# Patient Record
Sex: Male | Born: 1955 | Race: White | Hispanic: No | Marital: Married | Smoking: Current every day smoker
Health system: Southern US, Community
[De-identification: ages and names within clinical notes are randomized; demographics above are authoritative.]

## PROBLEM LIST (undated history)

## (undated) DIAGNOSIS — H9190 Unspecified hearing loss, unspecified ear: Secondary | ICD-10-CM

## (undated) DIAGNOSIS — E7800 Pure hypercholesterolemia, unspecified: Secondary | ICD-10-CM

## (undated) DIAGNOSIS — J449 Chronic obstructive pulmonary disease, unspecified: Secondary | ICD-10-CM

## (undated) HISTORY — PX: EYE SURGERY: SHX253

---

## 2004-11-05 ENCOUNTER — Ambulatory Visit: Payer: Self-pay | Admitting: Family Medicine

## 2006-01-09 ENCOUNTER — Ambulatory Visit: Payer: Self-pay | Admitting: Family Medicine

## 2006-01-16 ENCOUNTER — Ambulatory Visit: Payer: Self-pay | Admitting: Family Medicine

## 2006-03-12 ENCOUNTER — Ambulatory Visit (HOSPITAL_COMMUNITY): Admission: RE | Admit: 2006-03-12 | Discharge: 2006-03-13 | Payer: Self-pay | Admitting: Neurosurgery

## 2007-11-11 ENCOUNTER — Ambulatory Visit: Payer: Self-pay | Admitting: Internal Medicine

## 2009-01-06 ENCOUNTER — Ambulatory Visit: Payer: Self-pay | Admitting: Gastroenterology

## 2009-05-10 ENCOUNTER — Ambulatory Visit: Payer: Self-pay | Admitting: Internal Medicine

## 2010-04-28 ENCOUNTER — Ambulatory Visit: Payer: Self-pay | Admitting: Oncology

## 2010-06-11 ENCOUNTER — Ambulatory Visit: Payer: Self-pay | Admitting: Oncology

## 2010-06-20 ENCOUNTER — Ambulatory Visit: Payer: Self-pay | Admitting: Gastroenterology

## 2010-06-28 ENCOUNTER — Ambulatory Visit: Payer: Self-pay | Admitting: Oncology

## 2010-07-29 ENCOUNTER — Ambulatory Visit: Payer: Self-pay | Admitting: Oncology

## 2010-08-09 ENCOUNTER — Ambulatory Visit: Payer: Self-pay | Admitting: Urology

## 2010-08-11 ENCOUNTER — Emergency Department: Payer: Self-pay | Admitting: Emergency Medicine

## 2010-09-06 ENCOUNTER — Ambulatory Visit: Payer: Self-pay | Admitting: Urology

## 2011-04-10 ENCOUNTER — Ambulatory Visit: Payer: Self-pay | Admitting: Urology

## 2011-04-10 DIAGNOSIS — R0602 Shortness of breath: Secondary | ICD-10-CM

## 2011-04-11 ENCOUNTER — Ambulatory Visit: Payer: Self-pay | Admitting: Urology

## 2011-05-13 ENCOUNTER — Ambulatory Visit: Payer: Self-pay | Admitting: Urology

## 2011-08-22 ENCOUNTER — Ambulatory Visit: Payer: Self-pay | Admitting: Internal Medicine

## 2011-09-18 ENCOUNTER — Emergency Department: Payer: Self-pay | Admitting: Emergency Medicine

## 2011-09-18 LAB — LIPASE, BLOOD: Lipase: 152 U/L (ref 73–393)

## 2011-09-18 LAB — COMPREHENSIVE METABOLIC PANEL
Alkaline Phosphatase: 63 U/L (ref 50–136)
Anion Gap: 9 (ref 7–16)
BUN: 10 mg/dL (ref 7–18)
Bilirubin,Total: 0.3 mg/dL (ref 0.2–1.0)
Chloride: 103 mmol/L (ref 98–107)
Creatinine: 0.89 mg/dL (ref 0.60–1.30)
EGFR (African American): >60
EGFR (Non-African Amer.): >60
Osmolality: 273 (ref 275–301)
SGOT(AST): 22 U/L (ref 15–37)
SGPT (ALT): 28 U/L
Total Protein: 7.4 g/dL (ref 6.4–8.2)

## 2011-09-18 LAB — CBC
MCV: 95 fL (ref 80–100)
Platelet: 296 10*3/uL (ref 150–440)
RBC: 4.52 10*6/uL (ref 4.40–5.90)
RDW: 13.1 % (ref 11.5–14.5)
WBC: 10.3 10*3/uL (ref 3.8–10.6)

## 2011-09-18 LAB — URINALYSIS, COMPLETE
Bacteria: NONE SEEN
Bilirubin,UR: NEGATIVE
Ketone: NEGATIVE
RBC,UR: 1 /HPF (ref 0–5)
Specific Gravity: 1.005 (ref 1.003–1.030)
Squamous Epithelial: NONE SEEN
WBC UR: <1 /HPF (ref 0–5)

## 2012-09-01 ENCOUNTER — Emergency Department: Payer: Self-pay | Admitting: Emergency Medicine

## 2012-09-01 LAB — BASIC METABOLIC PANEL
BUN: 12 mg/dL (ref 7–18)
Chloride: 100 mmol/L (ref 98–107)
EGFR (African American): >60
EGFR (Non-African Amer.): >60

## 2012-09-01 LAB — URINALYSIS, COMPLETE
Glucose,UR: NEGATIVE mg/dL (ref 0–75)
Hyaline Cast: 1
Ph: 5 (ref 4.5–8.0)
RBC,UR: 160 /HPF (ref 0–5)
Squamous Epithelial: NONE SEEN

## 2012-09-01 LAB — CBC
MCHC: 33.8 g/dL (ref 32.0–36.0)
RBC: 4.94 10*6/uL (ref 4.40–5.90)
RDW: 13.7 % (ref 11.5–14.5)

## 2012-09-01 LAB — DIFFERENTIAL: Monocyte %: 4.9 %

## 2012-09-17 ENCOUNTER — Ambulatory Visit: Payer: Self-pay | Admitting: Urology

## 2013-03-23 DIAGNOSIS — J449 Chronic obstructive pulmonary disease, unspecified: Secondary | ICD-10-CM | POA: Diagnosis present

## 2015-01-18 ENCOUNTER — Emergency Department
Admission: EM | Admit: 2015-01-18 | Discharge: 2015-01-18 | Disposition: A | Discharge status: Home / Self Care | Payer: Self-pay | Attending: Emergency Medicine | Admitting: Emergency Medicine

## 2015-01-18 ENCOUNTER — Emergency Department: Payer: Self-pay

## 2015-01-18 ENCOUNTER — Encounter: Payer: Self-pay | Admitting: Emergency Medicine

## 2015-01-18 DIAGNOSIS — Z720 Tobacco use: Secondary | ICD-10-CM | POA: Insufficient documentation

## 2015-01-18 DIAGNOSIS — W109XXA Fall (on) (from) unspecified stairs and steps, initial encounter: Secondary | ICD-10-CM | POA: Insufficient documentation

## 2015-01-18 DIAGNOSIS — Y9389 Activity, other specified: Secondary | ICD-10-CM | POA: Insufficient documentation

## 2015-01-18 DIAGNOSIS — Y998 Other external cause status: Secondary | ICD-10-CM | POA: Insufficient documentation

## 2015-01-18 DIAGNOSIS — S93402A Sprain of unspecified ligament of left ankle, initial encounter: Secondary | ICD-10-CM | POA: Insufficient documentation

## 2015-01-18 DIAGNOSIS — Y92520 Airport as the place of occurrence of the external cause: Secondary | ICD-10-CM | POA: Insufficient documentation

## 2015-01-18 HISTORY — DX: Pure hypercholesterolemia, unspecified: E78.00

## 2015-01-18 MED ORDER — TRAMADOL HCL 50 MG PO TABS: 50.0000 mg | ORAL_TABLET | Freq: Four times a day (QID) | ORAL | Status: AC | PRN

## 2015-01-18 MED ORDER — IBUPROFEN 800 MG PO TABS: 800.0000 mg | ORAL_TABLET | Freq: Three times a day (TID) | ORAL | Status: DC | PRN

## 2015-01-18 MED ORDER — OXYCODONE-ACETAMINOPHEN 5-325 MG PO TABS
ORAL_TABLET | ORAL | Status: AC
  Filled 2015-01-18: qty 1

## 2015-01-18 MED ORDER — IBUPROFEN 800 MG PO TABS
ORAL_TABLET | ORAL | Status: AC
  Filled 2015-01-18: qty 1

## 2015-01-18 MED ORDER — IBUPROFEN 800 MG PO TABS
800.0000 mg | ORAL_TABLET | Freq: Once | ORAL | Status: AC
  Administered 2015-01-18: 800 mg via ORAL

## 2015-01-18 MED ORDER — OXYCODONE-ACETAMINOPHEN 5-325 MG PO TABS
1.0000 | ORAL_TABLET | Freq: Once | ORAL | Status: AC
  Administered 2015-01-18: 1 via ORAL

## 2015-01-18 NOTE — ED Provider Notes (Signed)
[facility] Emergency Department Provider Note  ____________________________________________  Time seen: Approximately 12:24 PM  I have reviewed the triage vital signs and the nursing notes.   HISTORY  Chief Complaint Ankle Pain    HPI Frederick Mitchell is a 59 y.o. male for pain and edema secondary to a fall last night.Patient fell down a flight of stairs last night. Awakened this morning with medial left ankle edema and increased pain. Patient rates his pain as a 7/10 with increased with weightbearing. Patient denies any other pain in his body except for the chief complaint.   Past Medical History  Diagnosis Date  . Hypercholesteremia unk    There are no active problems to display for this patient.   Past Surgical History  Procedure Laterality Date  . Eye surgery      Current Outpatient Rx  Name  Route  Sig  Dispense  Refill  . ibuprofen (ADVIL,MOTRIN) 800 MG tablet   Oral   Take 1 tablet (800 mg total) by mouth every 8 (eight) hours as needed for moderate pain.   15 tablet   0   . traMADol (ULTRAM) 50 MG tablet   Oral   Take 1 tablet (50 mg total) by mouth every 6 (six) hours as needed.   20 tablet   0     Allergies Review of patient's allergies indicates no known allergies.  No family history on file.  Social History History  Substance Use Topics  . Smoking status: Current Every Day Smoker  . Smokeless tobacco: Not on file  . Alcohol Use: No    Review of Systems Constitutional: No fever/chills Eyes: No visual changes. ENT: No sore throat. Cardiovascular: Denies chest pain. Respiratory: Denies shortness of breath. Gastrointestinal: No abdominal pain.  No nausea, no vomiting.  No diarrhea.  No constipation. Genitourinary: Negative for dysuria. Musculoskeletal: Left ankle pain and edema Skin: Negative for rash. Neurological: Negative for headaches, focal weakness or numbness. 10-point ROS otherwise  negative.  ____________________________________________   PHYSICAL EXAM:  VITAL SIGNS: ED Triage Vitals  Enc Vitals Group     BP 01/18/15 1205 112/69 mmHg     Pulse Rate 01/18/15 1205 75     Resp 01/18/15 1205 18     Temp 01/18/15 1205 98.4 F (36.9 C)     Temp Source 01/18/15 1205 Oral     SpO2 01/18/15 1205 100 %     Weight 01/18/15 1205 190 lb (86.183 kg)     Height 01/18/15 1205 '5\' 8"'$  (1.727 m)     Head Cir --      Peak Flow --      Pain Score 01/18/15 1206 7     Pain Loc --      Pain Edu? --      Excl. in [place]? --    Constitutional: Alert and oriented. Well appearing and in no acute distress. Eyes: Conjunctivae are normal. PERRL. EOMI. Head: Atraumatic. Nose: No congestion/rhinnorhea. Mouth/Throat: Mucous membranes are moist.  Oropharynx non-erythematous. Neck: No stridor. No deformity for nuchal range of motion nontender palpation. Hematological/Lymphatic/Immunilogical: No cervical lymphadenopathy. Cardiovascular: Normal rate, regular rhythm. Grossly normal heart sounds.  Good peripheral circulation. Respiratory: Normal respiratory effort.  No retractions. Lungs CTAB. Gastrointestinal: Soft and nontender. No distention. No abdominal bruits. No CVA tenderness. Musculoskeletal: No obvious deformity of the left ankle. Obvious edema. Tender palpation medial malleolus. Decreased range of motion  inversions secondary to complain of pain.  Neurologic:  Normal speech and language. No  gross focal neurologic deficits are appreciated. Speech is normal... Skin:  Skin is warm, dry and intact. No rash noted. Psychiatric: Mood and affect are normal. Speech and behavior are normal.  ____________________________________________   LABS (all labs ordered are listed, but only abnormal results are displayed)  Labs Reviewed - No data to display ____________________________________________  EKG   ____________________________________________  RADIOLOGY  I, Sable Feil,  personally viewed and evaluated these images as part of my medical decision making.  No acute findings ____________________________________________   PROCEDURES  Procedure(s) performed: None  Critical Care performed: No  ____________________________________________   INITIAL IMPRESSION / ASSESSMENT AND PLAN / ED COURSE  Pertinent labs & imaging results that were available during my care of the patient were reviewed by me and considered in my medical decision making (see chart for details).  Sprain ankle. Patient placed in a posterior ankle splint and given instructions on crutches. Patient prescribed tramadol and Motrin to take as needed for pain and swelling. Patient advised [facility]. ____________________________________________   FINAL CLINICAL IMPRESSION(S) / ED DIAGNOSES  Final diagnoses:  Sprain of left ankle, initial encounter      Sable Feil, PA-C 01/18/15 1342  Ahmed Prima, MD 01/18/15 [phone]

## 2015-01-18 NOTE — Discharge Instructions (Signed)
Ankle Sprain  An ankle sprain is an injury to the strong, fibrous tissues (ligaments) that hold your ankle bones together.   HOME CARE   · Put ice on your ankle for 1-2 days or as told by your doctor.  ¨ Put ice in a plastic bag.  ¨ Place a towel between your skin and the bag.  ¨ Leave the ice on for 15-20 minutes at a time, every 2 hours while you are awake.  · Only take medicine as told by your doctor.  · Raise (elevate) your injured ankle above the level of your heart as much as possible for 2-3 days.  · Use crutches if your doctor tells you to. Slowly put your own weight on the affected ankle. Use the crutches until you can walk without pain.  · If you have a plaster splint:  ¨ Do not rest it on anything harder than a pillow for 24 hours.  ¨ Do not put weight on it.  ¨ Do not get it wet.  ¨ Take it off to shower or bathe.  · If given, use an elastic wrap or support stocking for support. Take the wrap off if your toes lose feeling (numb), tingle, or turn cold or blue.  · If you have an air splint:  ¨ Add or let out air to make it comfortable.  ¨ Take it off at night and to shower and bathe.  ¨ Wiggle your toes and move your ankle up and down often while you are wearing it.  GET HELP IF:  · You have rapidly increasing bruising or puffiness (swelling).  · Your toes feel very cold.  · You lose feeling in your foot.  · Your medicine does not help your pain.  GET HELP RIGHT AWAY IF:   · Your toes lose feeling (numb) or turn blue.  · You have severe pain that is increasing.  MAKE SURE YOU:   · Understand these instructions.  · Will watch your condition.  · Will get help right away if you are not doing well or get worse.  Document Released: 01/01/2008 Document Revised: 11/29/2013 Document Reviewed: 01/27/2012  Exit[employer]® Patient Information ©2015 [employer]. This information is not intended to replace advice given to you by your health care provider. Make sure you discuss any questions you have with your health care  provider.

## 2015-01-18 NOTE — ED Notes (Signed)
Fell last pm  Left ankle swollen and tender

## 2016-07-30 DIAGNOSIS — F17200 Nicotine dependence, unspecified, uncomplicated: Secondary | ICD-10-CM | POA: Diagnosis not present

## 2016-07-30 DIAGNOSIS — J449 Chronic obstructive pulmonary disease, unspecified: Secondary | ICD-10-CM | POA: Diagnosis not present

## 2016-08-07 DIAGNOSIS — M5432 Sciatica, left side: Secondary | ICD-10-CM | POA: Diagnosis not present

## 2019-11-25 ENCOUNTER — Encounter: Payer: Self-pay | Admitting: Emergency Medicine

## 2019-11-25 ENCOUNTER — Emergency Department
Admission: EM | Admit: 2019-11-25 | Discharge: 2019-11-25 | Disposition: A | Discharge status: Home / Self Care | Payer: Medicare HMO | Attending: Emergency Medicine | Admitting: Emergency Medicine

## 2019-11-25 ENCOUNTER — Emergency Department: Payer: Medicare HMO

## 2019-11-25 ENCOUNTER — Other Ambulatory Visit: Payer: Self-pay

## 2019-11-25 DIAGNOSIS — Y999 Unspecified external cause status: Secondary | ICD-10-CM | POA: Diagnosis not present

## 2019-11-25 DIAGNOSIS — T18108A Unspecified foreign body in esophagus causing other injury, initial encounter: Secondary | ICD-10-CM | POA: Insufficient documentation

## 2019-11-25 DIAGNOSIS — F17200 Nicotine dependence, unspecified, uncomplicated: Secondary | ICD-10-CM | POA: Diagnosis not present

## 2019-11-25 DIAGNOSIS — T17208A Unspecified foreign body in pharynx causing other injury, initial encounter: Secondary | ICD-10-CM | POA: Diagnosis present

## 2019-11-25 DIAGNOSIS — Y929 Unspecified place or not applicable: Secondary | ICD-10-CM | POA: Insufficient documentation

## 2019-11-25 DIAGNOSIS — Y939 Activity, unspecified: Secondary | ICD-10-CM | POA: Insufficient documentation

## 2019-11-25 DIAGNOSIS — X58XXXA Exposure to other specified factors, initial encounter: Secondary | ICD-10-CM | POA: Insufficient documentation

## 2019-11-25 HISTORY — DX: Unspecified hearing loss, unspecified ear: H91.90

## 2019-11-25 MED ORDER — ALUM & MAG HYDROXIDE-SIMETH 200-200-20 MG/5ML PO SUSP
30.0000 mL | Freq: Once | ORAL | Status: AC
Start: 2019-11-25 — End: 2019-11-25
  Administered 2019-11-25: 06:00:00 30 mL via ORAL
  Filled 2019-11-25: qty 30

## 2019-11-25 MED ORDER — LIDOCAINE VISCOUS HCL 2 % MT SOLN
15.0000 mL | Freq: Once | OROMUCOSAL | Status: AC
  Administered 2019-11-25: 15 mL via ORAL
  Filled 2019-11-25: qty 15

## 2019-11-25 NOTE — ED Triage Notes (Signed)
Patient ambulatory to triage with steady gait, without difficulty or distress noted, mask in place; using sign language video interpreter, pt reports he ate shrimp at 630pm and shell feels as if it is "stuck in throat" causing discomfort; pt able to tolerate POs without difficulty, denies difficulty breathing

## 2019-11-25 NOTE — ED Provider Notes (Signed)
[facility] Emergency Department Provider Note  ____________________________________________   First MD Initiated Contact with Patient 11/25/19 [phone]     (approximate)  I have reviewed the triage vital signs and the nursing notes.   HISTORY  Chief Complaint Foreign Body History obtained via sign language interpreter  HPI Frederick Mitchell is a 64 y.o. male presents to the emergency department secondary to foreign body sensation in his throat.  Patient states that he was eating shrimp last night and believe that he did not peel the shrimp completely before ingesting it and since that time is felt a foreign body sensation in his throat.  Patient states that this began at 6:30 PM last night.  Patient states that current discomfort is 4 out of 10.  Patient stated he has drank copious amounts of water and soda without any improvement of discomfort.       Past Medical History:  Diagnosis Date  . Deaf   . Hypercholesteremia unk    There are no problems to display for this patient.   Past Surgical History:  Procedure Laterality Date  . EYE SURGERY      Prior to Admission medications   Medication Sig Start Date End Date Taking? Authorizing Provider  ibuprofen (ADVIL,MOTRIN) 800 MG tablet Take 1 tablet (800 mg total) by mouth every 8 (eight) hours as needed for moderate pain. 01/18/15   Sable Feil, PA-C    Allergies Patient has no known allergies.  No family history on file.  Social History Social History   Tobacco Use  . Smoking status: Current Every Day Smoker  . Smokeless tobacco: Never Used  Substance Use Topics  . Alcohol use: No  . Drug use: Not on file    Review of Systems Constitutional: No fever/chills Eyes: No visual changes. ENT: Positive for foreign body sensation in throat. Cardiovascular: Denies chest pain. Respiratory: Denies shortness of breath. Gastrointestinal: No abdominal pain.  No nausea, no vomiting.  No diarrhea.  No  constipation. Genitourinary: Negative for dysuria. Musculoskeletal: Negative for neck pain.  Negative for back pain. Integumentary: Negative for rash. Neurological: Negative for headaches, focal weakness or numbness.   ____________________________________________   PHYSICAL EXAM:  VITAL SIGNS: ED Triage Vitals  Enc Vitals Group     BP 11/25/19 0304 133/70     Pulse Rate 11/25/19 0304 94     Resp 11/25/19 0304 20     Temp 11/25/19 0304 97.9 F (36.6 C)     Temp Source 11/25/19 0304 Oral     SpO2 11/25/19 0304 95 %     Weight 11/25/19 0310 87.1 kg (192 lb)     Height 11/25/19 0310 1.702 m (5\' 7" )     Head Circumference --      Peak Flow --      Pain Score 11/25/19 0309 4     Pain Loc --      Pain Edu? --      Excl. in [place]? --     Constitutional: Alert and oriented.  Eyes: Conjunctivae are normal.  Mouth/Throat: Patient is wearing a mask. Neck: No stridor.  No meningeal signs.   Cardiovascular: Normal rate, regular rhythm. Good peripheral circulation. Grossly normal heart sounds. Respiratory: Normal respiratory effort.  No retractions. Gastrointestinal: Soft and nontender. No distention.  Musculoskeletal: No lower extremity tenderness nor edema. No gross deformities of extremities. Neurologic:  Normal speech and language. No gross focal neurologic deficits are appreciated.  Skin:  Skin is warm, dry  and intact. Psychiatric: Mood and affect are normal. Speech and behavior are normal.  ____________________________________________     RADIOLOGY I, [place], personally viewed and evaluated these images (plain radiographs) as part of my medical decision making, as well as reviewing the written report by the radiologist.  ED MD interpretation: No radiopaque foreign body  Official radiology report(s): DG Neck Soft Tissue  Result Date: 11/25/2019 CLINICAL DATA:  Question of foreign body EXAM: NECK SOFT TISSUES - 1+ VIEW COMPARISON:  None. FINDINGS: There is no  evidence of retropharyngeal soft tissue swelling or epiglottic enlargement. The cervical airway is unremarkable and no radio-opaque foreign body identified. The patient is status post ACDF at C6-C7. There is an anterolisthesis of 2 C4 on C5 measuring 3 mm. IMPRESSION: No radiopaque foreign body. Electronically Signed   By: Prudencio Pair M.D.   On: 11/25/2019 03:39     Procedures   ____________________________________________   INITIAL IMPRESSION / MDM / [place] / ED COURSE  As part of my medical decision making, I reviewed the following data within the electronic MEDICAL RECORD NUMBER  64 year old male presented with above-stated history and physical exam differential diagnosis including but not limited to esophageal foreign body, globus pallidus.  Soft tissue x-ray of the neck reveals no radiopaque foreign body.  Patient given GI cocktail and cola to drink in the room which he tolerated without any difficulty.  Patient will be referred to ENT for further outpatient evaluation.  ____________________________________________  FINAL CLINICAL IMPRESSION(S) / ED DIAGNOSES  Final diagnoses:  Foreign body in esophagus, initial encounter     MEDICATIONS GIVEN DURING THIS VISIT:  Medications  alum & mag hydroxide-simeth (MAALOX/MYLANTA) 200-200-20 MG/5ML suspension 30 mL (30 mLs Oral Given 11/25/19 0541)    And  lidocaine (XYLOCAINE) 2 % viscous mouth solution 15 mL (15 mLs Oral Given 11/25/19 0541)     ED Discharge Orders    None      *Please note:  Frederick Mitchell was evaluated in Emergency Department on 11/25/2019 for the symptoms described in the history of present illness. He was evaluated in the context of the global COVID-19 pandemic, which necessitated consideration that the patient might be at risk for infection with the SARS-CoV-2 virus that causes COVID-19. Institutional protocols and algorithms that pertain to the evaluation of patients at risk for COVID-19 are in a state  of rapid change based on information released by regulatory bodies including the CDC and federal and state organizations. These policies and algorithms were followed during the patient's care in the ED.  Some ED evaluations and interventions may be delayed as a result of limited staffing during the pandemic.*  Note:  This document was prepared using Dragon voice recognition software and may include unintentional dictation errors.   Gregor Hams, MD 11/25/19 2250

## 2021-06-15 ENCOUNTER — Inpatient Hospital Stay: Payer: Medicare HMO | Attending: Oncology | Admitting: Oncology

## 2021-06-15 ENCOUNTER — Other Ambulatory Visit: Payer: Self-pay

## 2021-06-15 ENCOUNTER — Encounter: Payer: Self-pay | Admitting: *Deleted

## 2021-06-15 ENCOUNTER — Encounter: Payer: Self-pay | Admitting: Oncology

## 2021-06-15 ENCOUNTER — Inpatient Hospital Stay: Payer: Medicare HMO

## 2021-06-15 VITALS — BP 110/67 | HR 87 | Temp 97.8°F | Resp 17 | Wt 162.0 lb

## 2021-06-15 DIAGNOSIS — R918 Other nonspecific abnormal finding of lung field: Secondary | ICD-10-CM

## 2021-06-15 DIAGNOSIS — R911 Solitary pulmonary nodule: Secondary | ICD-10-CM | POA: Diagnosis not present

## 2021-06-15 DIAGNOSIS — F17210 Nicotine dependence, cigarettes, uncomplicated: Secondary | ICD-10-CM | POA: Diagnosis not present

## 2021-06-15 DIAGNOSIS — R634 Abnormal weight loss: Secondary | ICD-10-CM | POA: Diagnosis not present

## 2021-06-15 NOTE — Progress Notes (Signed)
Patient states thinks he may have lupus.

## 2021-06-15 NOTE — Progress Notes (Signed)
Met with patient and his wife during initial consult with Dr. Janese Banks. Interpreter present throughout entire visit. All questions answered during visit. Reviewed upcoming appts. Informed that will work on getting his CT scan scheduled sooner than 12/13. Pt informed that will need to pick up prep day before CT scan. Will call pt's sister with CT scan results and discuss scheduling further follow up if needed. Contact info given and instructed to call with any questions or needs. Pt and his family verbalized understanding.   Will get outside images uploaded for comparison. ASL interpreter requested for CT scan.

## 2021-06-15 NOTE — Progress Notes (Signed)
Hematology/Oncology Consult note [facility] Telephone:([phone] [phone]  Patient Care Team: Gayland Curry, MD as PCP - General (Family Medicine)   Name of the patient: Frederick Mitchell  [id number]  1955-10-16    Reason for referral-history of lung nodule   Referring physician- Dr. Gayland Curry  Date of visit: 06/15/21   History of presenting illness- Patient is a 65 year old male who was recently seen by Dr. Posey Pronto from rheumatology for symptoms of ongoing skin rash which was biopsied and was consistent with lupus.  Patient is currently on Plaquenil for the same.  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  Patient had undergone CT chest as a part of lung cancer screening program in April 2021 which had shown a left upper lobe 1.6 x 1 cm lung nodule and a 6 mm soft tissue nodule.  PET CT scan was supposed to be done thereafter but patient never showed up for that.  Now given there is a concern for dermatomyositis which can be associated with malignancy patient has been referred to oncology.  He continues to smoke about one half 6 pack of cigarettes per day.  He has lost about 30 pounds over the last 1 year.  History obtained with the help of sign language interpreter  ECOG PS- 2  Pain scale- 3   Review of systems- Review of Systems  Constitutional:  Positive for malaise/fatigue and weight loss. Negative for chills and fever.       Lack of appetite  HENT:  Negative for congestion, ear discharge and nosebleeds.   Eyes:  Negative for blurred vision.  Respiratory:  Negative for cough, hemoptysis, sputum production, shortness of breath and wheezing.   Cardiovascular:  Negative for chest pain, palpitations, orthopnea and claudication.  Gastrointestinal:  Negative for abdominal pain, blood in stool, constipation, diarrhea, heartburn, melena,  nausea and vomiting.  Genitourinary:  Negative for dysuria, flank pain, frequency, hematuria and urgency.  Musculoskeletal:  Negative for back pain, joint pain and myalgias.  Skin:  Positive for rash.  Neurological:  Negative for dizziness, tingling, focal weakness, seizures, weakness and headaches.  Endo/Heme/Allergies:  Does not bruise/bleed easily.  Psychiatric/Behavioral:  Negative for depression and suicidal ideas. The patient does not have insomnia.    No Known Allergies  There are no problems to display for this patient.    Past Medical History:  Diagnosis Date   Deaf    Hypercholesteremia unk     Past Surgical History:  Procedure Laterality Date   EYE SURGERY      Social History   Socioeconomic History   Marital status: Married    Spouse name: Not on file   Number of children: Not on file   Years of education: Not on file   Highest education level: Not on file  Occupational History   Not on file  Tobacco Use   Smoking status: Every Day   Smokeless tobacco: Never  Substance and Sexual Activity   Alcohol use: No   Drug use: Not Currently   Sexual activity: Not Currently    Birth control/protection: None  Other Topics Concern   Not on file  Social History Narrative   Not on file   Social Determinants of Health   Financial Resource Strain: Not on file  Food Insecurity: Not on file  Transportation Needs: Not on file  Physical Activity: Not on file  Stress:  Not on file  Social Connections: Not on file  Intimate Partner Violence: Not on file     Family History  Problem Relation Age of Onset   Cancer Father    Cancer Sister    Cancer Maternal Uncle    Cancer Paternal Aunt    Cancer Maternal Grandmother    Cancer Maternal Grandfather      Current Outpatient Medications:    aspirin 81 MG EC tablet, Take by mouth., Disp: , Rfl:    atorvastatin (LIPITOR) 80 MG tablet, Take by mouth., Disp: , Rfl:    carboxymethylcellulose (REFRESH PLUS) 0.5 %  SOLN, Apply to eye., Disp: , Rfl:    cephALEXin (KEFLEX) 250 MG capsule, cephalexin 250 mg capsule, Disp: , Rfl:    doxepin (SINEQUAN) 50 MG capsule, Take 50 mg by mouth at bedtime., Disp: , Rfl:    fluticasone (FLONASE) 50 MCG/ACT nasal spray, SHAKE LIQUID AND USE 2 SPRAYS IN EACH NOSTRIL EVERY DAY, Disp: , Rfl:    Fluticasone-Umeclidin-Vilant (TRELEGY ELLIPTA) 100-62.5-25 MCG/ACT AEPB, Inhale into the lungs., Disp: , Rfl:    hydroxychloroquine (PLAQUENIL) 200 MG tablet, Take 200 mg by mouth 2 (two) times daily., Disp: , Rfl:    hydrOXYzine (ATARAX/VISTARIL) 10 MG tablet, Take by mouth., Disp: , Rfl:    ibuprofen (ADVIL,MOTRIN) 800 MG tablet, Take 1 tablet (800 mg total) by mouth every 8 (eight) hours as needed for moderate pain., Disp: 15 tablet, Rfl: 0   meloxicam (MOBIC) 15 MG tablet, Take by mouth., Disp: , Rfl:    naproxen (EC NAPROSYN) 500 MG EC tablet, naproxen 500 mg tablet,delayed release, Disp: , Rfl:    triamcinolone cream (KENALOG) 0.1 %, Apply topically 2 (two) times daily., Disp: , Rfl:    Vitamin D, Ergocalciferol, (DRISDOL) 1.25 MG (50000 UNIT) CAPS capsule, Take 50,000 Units by mouth 3 (three) times a week., Disp: , Rfl:    Physical exam:  Vitals:   06/15/21 1150  BP: 110/67  Pulse: 87  Resp: 17  Temp: 97.8 F (36.6 C)  SpO2: 97%  Weight: 162 lb (73.5 kg)   Physical Exam Constitutional:      Comments: Sitting in a wheelchair.  Appears fatigued  Cardiovascular:     Rate and Rhythm: Normal rate and regular rhythm.     Heart sounds: Normal heart sounds.  Pulmonary:     Effort: Pulmonary effort is normal.     Breath sounds: Normal breath sounds.  Abdominal:     General: Bowel sounds are normal.     Palpations: Abdomen is soft.  Lymphadenopathy:     Comments: No palpable cervical or supraclavicular adenopathy  Skin:    General: Skin is warm and dry.     Comments: Scaly erythematous rash noted over face, base of the neck bilateral forearms and hands   Neurological:     Mental Status: He is alert and oriented to person, place, and time.       CMP Latest Ref Rng & Units 09/01/2012  Glucose 65 - 99 mg/dL 132(H)  BUN 7 - 18 mg/dL 12  Creatinine 0.60 - 1.30 mg/dL 1.01  Sodium 136 - 145 mmol/L 131(L)  Potassium 3.5 - 5.1 mmol/L 3.9  Chloride 98 - 107 mmol/L 100  CO2 21 - 32 mmol/L 26  Calcium 8.5 - 10.1 mg/dL 8.9  Total Protein 6.4 - 8.2 g/dL -  Total Bilirubin 0.2 - 1.0 mg/dL -  Alkaline Phos 50 - 136 Unit/L -  AST 15 - 37 Unit/L -  ALT U/L -   CBC Latest Ref Rng & Units 09/01/2012  WBC 3.8 - 10.6 x10 3/mm 3 20.0(H)  Hemoglobin 13.0 - 18.0 g/dL 16.0  Hematocrit 40.0 - 52.0 % 47.2  Platelets 150 - 440 x10 3/mm 3 304    Assessment and plan- Patient is a 65 y.o. male with history of lung nodule referred for further work-up  Patient was recently seen by rheumatology and there was a concern that patient may have dermatomyositis which can be associated with underlying malignancy.  Patient noted to have a left upper lobe lung nodule of 1.6 cm in April 2021 which she did not follow-up with.  I will therefore proceed with a CT chest abdomen and pelvis with contrast at this time to see if there has been any growth in the size of the lung nodule as well as to see if there are any other concerning areas for malignancy.  Based on the CT scans we will assess if the patient needs a PET scan or directly proceed for biopsy based on findings.  I will see him in 3 to 4 weeks time after scans and potential biopsies.   Thank you for this kind referral and the opportunity to participate in the care of this patient   Visit Diagnosis 1. Lung nodule   2. Loss of weight     Dr. Randa Evens, MD, MPH [facility] [phone] 06/15/2021  12:43 PM

## 2021-06-19 ENCOUNTER — Ambulatory Visit
Admission: RE | Admit: 2021-06-19 | Discharge: 2021-06-19 | Disposition: A | Discharge status: Home / Self Care | Payer: Self-pay | Source: Ambulatory Visit | Attending: Oncology | Admitting: Oncology

## 2021-06-19 DIAGNOSIS — R918 Other nonspecific abnormal finding of lung field: Secondary | ICD-10-CM

## 2021-06-19 NOTE — Addendum Note (Signed)
Addended by: Telford Nab on: 06/19/2021 08:47 AM   Modules accepted: Orders

## 2021-07-05 ENCOUNTER — Inpatient Hospital Stay
Admission: EM | Admit: 2021-07-05 | Discharge: 2021-07-10 | DRG: 181 | Disposition: A | Discharge status: Home Health | Payer: Medicare HMO | Attending: Internal Medicine | Admitting: Internal Medicine

## 2021-07-05 ENCOUNTER — Telehealth: Payer: Self-pay | Admitting: *Deleted

## 2021-07-05 ENCOUNTER — Other Ambulatory Visit: Payer: Self-pay

## 2021-07-05 ENCOUNTER — Emergency Department: Payer: Medicare HMO

## 2021-07-05 ENCOUNTER — Ambulatory Visit
Admission: RE | Admit: 2021-07-05 | Discharge: 2021-07-05 | Disposition: A | Discharge status: Home / Self Care | Payer: Medicare HMO | Source: Ambulatory Visit | Attending: Oncology | Admitting: Oncology

## 2021-07-05 ENCOUNTER — Encounter: Payer: Self-pay | Admitting: Emergency Medicine

## 2021-07-05 DIAGNOSIS — Z79899 Other long term (current) drug therapy: Secondary | ICD-10-CM

## 2021-07-05 DIAGNOSIS — J449 Chronic obstructive pulmonary disease, unspecified: Secondary | ICD-10-CM | POA: Diagnosis present

## 2021-07-05 DIAGNOSIS — K920 Hematemesis: Secondary | ICD-10-CM | POA: Diagnosis present

## 2021-07-05 DIAGNOSIS — J432 Centrilobular emphysema: Secondary | ICD-10-CM | POA: Diagnosis present

## 2021-07-05 DIAGNOSIS — L930 Discoid lupus erythematosus: Secondary | ICD-10-CM | POA: Diagnosis present

## 2021-07-05 DIAGNOSIS — R7989 Other specified abnormal findings of blood chemistry: Secondary | ICD-10-CM | POA: Diagnosis present

## 2021-07-05 DIAGNOSIS — F17210 Nicotine dependence, cigarettes, uncomplicated: Secondary | ICD-10-CM | POA: Diagnosis present

## 2021-07-05 DIAGNOSIS — E8881 Metabolic syndrome: Secondary | ICD-10-CM | POA: Diagnosis present

## 2021-07-05 DIAGNOSIS — E7800 Pure hypercholesterolemia, unspecified: Secondary | ICD-10-CM | POA: Diagnosis present

## 2021-07-05 DIAGNOSIS — I2510 Atherosclerotic heart disease of native coronary artery without angina pectoris: Secondary | ICD-10-CM | POA: Diagnosis present

## 2021-07-05 DIAGNOSIS — R310 Gross hematuria: Secondary | ICD-10-CM | POA: Diagnosis present

## 2021-07-05 DIAGNOSIS — N200 Calculus of kidney: Secondary | ICD-10-CM

## 2021-07-05 DIAGNOSIS — Z791 Long term (current) use of non-steroidal anti-inflammatories (NSAID): Secondary | ICD-10-CM

## 2021-07-05 DIAGNOSIS — H9190 Unspecified hearing loss, unspecified ear: Secondary | ICD-10-CM | POA: Diagnosis present

## 2021-07-05 DIAGNOSIS — Z7952 Long term (current) use of systemic steroids: Secondary | ICD-10-CM

## 2021-07-05 DIAGNOSIS — R042 Hemoptysis: Secondary | ICD-10-CM | POA: Diagnosis present

## 2021-07-05 DIAGNOSIS — Z809 Family history of malignant neoplasm, unspecified: Secondary | ICD-10-CM | POA: Diagnosis not present

## 2021-07-05 DIAGNOSIS — C7951 Secondary malignant neoplasm of bone: Secondary | ICD-10-CM | POA: Diagnosis present

## 2021-07-05 DIAGNOSIS — C7802 Secondary malignant neoplasm of left lung: Secondary | ICD-10-CM | POA: Diagnosis present

## 2021-07-05 DIAGNOSIS — I739 Peripheral vascular disease, unspecified: Secondary | ICD-10-CM | POA: Diagnosis present

## 2021-07-05 DIAGNOSIS — R634 Abnormal weight loss: Secondary | ICD-10-CM

## 2021-07-05 DIAGNOSIS — C799 Secondary malignant neoplasm of unspecified site: Secondary | ICD-10-CM

## 2021-07-05 DIAGNOSIS — R591 Generalized enlarged lymph nodes: Secondary | ICD-10-CM | POA: Diagnosis present

## 2021-07-05 DIAGNOSIS — R911 Solitary pulmonary nodule: Secondary | ICD-10-CM | POA: Insufficient documentation

## 2021-07-05 DIAGNOSIS — C786 Secondary malignant neoplasm of retroperitoneum and peritoneum: Secondary | ICD-10-CM | POA: Diagnosis present

## 2021-07-05 DIAGNOSIS — Z20822 Contact with and (suspected) exposure to covid-19: Secondary | ICD-10-CM | POA: Diagnosis present

## 2021-07-05 DIAGNOSIS — C3480 Malignant neoplasm of overlapping sites of unspecified bronchus and lung: Secondary | ICD-10-CM | POA: Diagnosis present

## 2021-07-05 DIAGNOSIS — R9389 Abnormal findings on diagnostic imaging of other specified body structures: Secondary | ICD-10-CM | POA: Diagnosis not present

## 2021-07-05 DIAGNOSIS — R319 Hematuria, unspecified: Secondary | ICD-10-CM | POA: Diagnosis present

## 2021-07-05 DIAGNOSIS — E871 Hypo-osmolality and hyponatremia: Secondary | ICD-10-CM | POA: Diagnosis present

## 2021-07-05 DIAGNOSIS — C3490 Malignant neoplasm of unspecified part of unspecified bronchus or lung: Secondary | ICD-10-CM

## 2021-07-05 LAB — CBC
HCT: 40.9 % (ref 39.0–52.0)
Hemoglobin: 13.8 g/dL (ref 13.0–17.0)
MCH: 31.2 pg (ref 26.0–34.0)
MCHC: 33.7 g/dL (ref 30.0–36.0)
MCV: 92.5 fL (ref 80.0–100.0)
Platelets: 178 10*3/uL (ref 150–400)
RBC: 4.42 MIL/uL (ref 4.22–5.81)
RDW: 14.6 % (ref 11.5–15.5)
WBC: 9.6 10*3/uL (ref 4.0–10.5)
nRBC: 0 % (ref 0.0–0.2)

## 2021-07-05 LAB — URINALYSIS, ROUTINE W REFLEX MICROSCOPIC
Bilirubin Urine: NEGATIVE
Glucose, UA: NEGATIVE mg/dL
Ketones, ur: NEGATIVE mg/dL
Leukocytes,Ua: NEGATIVE
Nitrite: NEGATIVE
Protein, ur: NEGATIVE mg/dL
Specific Gravity, Urine: 1.01 (ref 1.005–1.030)
pH: 6.5 (ref 5.0–8.0)

## 2021-07-05 LAB — COMPREHENSIVE METABOLIC PANEL
ALT: 64 U/L — ABNORMAL HIGH (ref 0–44)
AST: 93 U/L — ABNORMAL HIGH (ref 15–41)
Albumin: 3.4 g/dL — ABNORMAL LOW (ref 3.5–5.0)
Alkaline Phosphatase: 73 U/L (ref 38–126)
Anion gap: 18 — ABNORMAL HIGH (ref 5–15)
BUN: 16 mg/dL (ref 8–23)
CO2: 17 mmol/L — ABNORMAL LOW (ref 22–32)
Calcium: 9.6 mg/dL (ref 8.9–10.3)
Chloride: 96 mmol/L — ABNORMAL LOW (ref 98–111)
Creatinine, Ser: 0.52 mg/dL — ABNORMAL LOW (ref 0.61–1.24)
GFR, Estimated: >60 mL/min (ref >60)
Glucose, Bld: 69 mg/dL — ABNORMAL LOW (ref 70–99)
Potassium: 3.8 mmol/L (ref 3.5–5.1)
Sodium: 131 mmol/L — ABNORMAL LOW (ref 135–145)
Total Bilirubin: 0.6 mg/dL (ref 0.3–1.2)
Total Protein: 7.1 g/dL (ref 6.5–8.1)

## 2021-07-05 LAB — TYPE AND SCREEN
ABO/RH(D): A POS
Antibody Screen: NEGATIVE

## 2021-07-05 LAB — LIPASE, BLOOD: Lipase: 45 U/L (ref 11–51)

## 2021-07-05 LAB — URINALYSIS, MICROSCOPIC (REFLEX): Bacteria, UA: NONE SEEN

## 2021-07-05 LAB — PROTIME-INR
INR: 0.9 (ref 0.8–1.2)
Prothrombin Time: 12.5 seconds (ref 11.4–15.2)

## 2021-07-05 LAB — RESP PANEL BY RT-PCR (FLU A&B, COVID) ARPGX2
Influenza A by PCR: NEGATIVE
Influenza B by PCR: NEGATIVE
SARS Coronavirus 2 by RT PCR: NEGATIVE

## 2021-07-05 MED ORDER — HYDROXYZINE HCL 10 MG PO TABS
10.0000 mg | ORAL_TABLET | Freq: Three times a day (TID) | ORAL | Status: DC | PRN
  Filled 2021-07-05: qty 1

## 2021-07-05 MED ORDER — SODIUM CHLORIDE 0.9% FLUSH: 3.0000 mL | INTRAVENOUS | Status: DC | PRN

## 2021-07-05 MED ORDER — HYDROXYCHLOROQUINE SULFATE 200 MG PO TABS
200.0000 mg | ORAL_TABLET | Freq: Two times a day (BID) | ORAL | Status: DC
  Administered 2021-07-06 – 2021-07-10 (×9): 200 mg via ORAL
  Filled 2021-07-05 (×9): qty 1

## 2021-07-05 MED ORDER — IOHEXOL 300 MG/ML  SOLN
100.0000 mL | Freq: Once | INTRAMUSCULAR | Status: AC | PRN
  Administered 2021-07-05: 100 mL via INTRAVENOUS

## 2021-07-05 MED ORDER — SODIUM CHLORIDE 0.9% FLUSH
3.0000 mL | Freq: Two times a day (BID) | INTRAVENOUS | Status: DC
  Administered 2021-07-05 – 2021-07-09 (×7): 3 mL via INTRAVENOUS

## 2021-07-05 MED ORDER — DOXEPIN HCL 50 MG PO CAPS
50.0000 mg | ORAL_CAPSULE | Freq: Every day | ORAL | Status: DC
  Administered 2021-07-06 – 2021-07-09 (×4): 50 mg via ORAL
  Filled 2021-07-05 (×6): qty 1

## 2021-07-05 MED ORDER — PANTOPRAZOLE SODIUM 40 MG IV SOLR
40.0000 mg | INTRAVENOUS | Status: DC
  Administered 2021-07-05: 40 mg via INTRAVENOUS
  Filled 2021-07-05: qty 40

## 2021-07-05 MED ORDER — FLUTICASONE PROPIONATE 50 MCG/ACT NA SUSP
1.0000 | Freq: Every day | NASAL | Status: DC | PRN
  Filled 2021-07-05: qty 16

## 2021-07-05 MED ORDER — SODIUM CHLORIDE 0.9 % IV SOLN: 250.0000 mL | INTRAVENOUS | Status: DC | PRN

## 2021-07-05 MED ORDER — PANTOPRAZOLE SODIUM 40 MG IV SOLR
40.0000 mg | Freq: Two times a day (BID) | INTRAVENOUS | Status: DC
  Administered 2021-07-06 – 2021-07-09 (×7): 40 mg via INTRAVENOUS
  Filled 2021-07-05 (×8): qty 40

## 2021-07-05 MED ORDER — SODIUM CHLORIDE 0.9 % IV SOLN: INTRAVENOUS | Status: AC

## 2021-07-05 MED ORDER — ATORVASTATIN CALCIUM 20 MG PO TABS: 80.0000 mg | ORAL_TABLET | Freq: Every day | ORAL | Status: DC

## 2021-07-05 MED ORDER — NICOTINE 21 MG/24HR TD PT24
21.0000 mg | MEDICATED_PATCH | Freq: Every day | TRANSDERMAL | Status: DC
  Filled 2021-07-05: qty 1

## 2021-07-05 NOTE — H&P (Signed)
History and Physical    Frederick Mitchell YCX:[id number] DOB: 08/27/1955 DOA: 07/05/2021  PCP: Gayland Curry, MD    Patient coming from:  Home    Chief Complaint:  Coughing up blood   HPI:  Frederick Mitchell is a 65 y.o. male seen in ed with complaints of coughing up blood with blood streaking sputum that is been going on multiple episodes at home today.  Patient is currently under investigation for suspected Lung cancer with oncologist visit on 06/15/2021 reviewed.  Chart review does show patient is taking Mobic and naproxen and motrin. Pt is deaf and translator is used. Pt reports weak and chills and no other complaints otherwise.      Pt has past medical history of tobacco abuse, deaf, dyslipidemia.  ED Course:  Vitals:   07/05/21 1631 07/05/21 2100 07/05/21 2130  BP: 121/70 121/66 127/68  Pulse: 90 78 85  Resp: 20  19  Temp: 98.7 F (37.1 C)    TempSrc: Oral    SpO2: 93% 93% 93%  Weight: 73.5 kg    Height: 5\' 7"  (1.702 m)    The emergency room patient is alert awake oriented afebrile and oxygenating at 93% on 2 L nasal cannula. Blood work today shows mild hyponatremia with sodium of 131, glucose of 69 normal creatinine, mild elevation of ALT AST at 64/93 respectively, anion gap of 18. CBC shows a normal white count of 9.6 hemoglobin 13.8 and platelet count of 178. Urinalysis is within normal limits.  Hemoglobin A1c on 06/25/2021 was 5.3.  Review of Systems:  Review of Systems  Constitutional:  Positive for chills and weight loss.  HENT:  Positive for hearing loss.   Eyes: Negative.   Respiratory:  Positive for cough, hemoptysis and shortness of breath.   Cardiovascular: Negative.   Gastrointestinal: Negative.   Musculoskeletal: Negative.   Skin: Negative.   Neurological:  Positive for weakness.  Psychiatric/Behavioral: Negative.      Past Medical History:  Diagnosis Date   Deaf    Hypercholesteremia unk    Past Surgical History:  Procedure Laterality Date    EYE SURGERY       reports that he has been smoking cigarettes. He has never used smokeless tobacco. He reports that he does not currently use drugs. He reports that he does not drink alcohol.  No Known Allergies  Family History  Problem Relation Age of Onset   Cancer Father    Cancer Sister    Cancer Maternal Uncle    Cancer Paternal Aunt    Cancer Maternal Grandmother    Cancer Maternal Grandfather     Prior to Admission medications   Medication Sig Start Date End Date Taking? Authorizing Provider  atorvastatin (LIPITOR) 80 MG tablet Take by mouth. 05/31/21 05/31/22  [provider]  carboxymethylcellulose (REFRESH PLUS) 0.5 % SOLN Apply to eye.    [provider]  cephALEXin (KEFLEX) 250 MG capsule cephalexin 250 mg capsule    [provider]  doxepin (SINEQUAN) 50 MG capsule Take 50 mg by mouth at bedtime. 05/15/21   [provider]  fluticasone (FLONASE) 50 MCG/ACT nasal spray SHAKE LIQUID AND USE 2 SPRAYS IN EACH NOSTRIL EVERY DAY 11/17/19   [provider]  Fluticasone-Umeclidin-Vilant (TRELEGY ELLIPTA) 100-62.5-25 MCG/ACT AEPB Inhale into the lungs. 01/11/20   [provider]  hydroxychloroquine (PLAQUENIL) 200 MG tablet Take 200 mg by mouth 2 (two) times daily. 06/11/21   [provider]  hydrOXYzine (ATARAX/VISTARIL) 10 MG tablet  Take by mouth. 06/19/20   [provider]  ibuprofen (ADVIL,MOTRIN) 800 MG tablet Take 1 tablet (800 mg total) by mouth every 8 (eight) hours as needed for moderate pain. 01/18/15   Sable Feil, PA-C  meloxicam (MOBIC) 15 MG tablet Take by mouth. 05/31/21 05/31/22  [provider]  naproxen (EC NAPROSYN) 500 MG EC tablet naproxen 500 mg tablet,delayed release    [provider]  triamcinolone cream (KENALOG) 0.1 % Apply topically 2 (two) times daily. 05/01/18   [provider]  Vitamin D, Ergocalciferol, (DRISDOL) 1.25 MG (50000 UNIT) CAPS capsule Take  50,000 Units by mouth 3 (three) times a week. 05/16/21   [provider]    Physical Exam: Vitals:   07/05/21 1631 07/05/21 2100 07/05/21 2130  BP: 121/70 121/66 127/68  Pulse: 90 78 85  Resp: 20  19  Temp: 98.7 F (37.1 C)    TempSrc: Oral    SpO2: 93% 93% 93%  Weight: 73.5 kg    Height: 5\' 7"  (1.702 m)     Physical Exam Vitals reviewed.  Constitutional:      Appearance: Normal appearance. He is ill-appearing.  HENT:     Head: Normocephalic and atraumatic.     Right Ear: External ear normal.     Left Ear: External ear normal.     Nose: Nose normal.     Mouth/Throat:     Mouth: Mucous membranes are moist.  Eyes:     Extraocular Movements: Extraocular movements intact.     Pupils: Pupils are equal, round, and reactive to light.  Neck:     Vascular: No carotid bruit.  Cardiovascular:     Rate and Rhythm: Normal rate and regular rhythm.     Pulses: Normal pulses.     Heart sounds: Normal heart sounds.  Pulmonary:     Effort: Pulmonary effort is normal.     Breath sounds: Normal breath sounds.  Abdominal:     General: Bowel sounds are normal. There is no distension.     Palpations: Abdomen is soft. There is no mass.     Tenderness: There is no abdominal tenderness. There is no guarding.     Hernia: No hernia is present.  Musculoskeletal:     Right lower leg: No edema.     Left lower leg: No edema.  Skin:    General: Skin is warm and dry.  Neurological:     General: No focal deficit present.     Mental Status: He is alert and oriented to person, place, and time.  Psychiatric:        Mood and Affect: Mood normal.        Behavior: Behavior normal.    Labs on Admission: I have personally reviewed following labs and imaging studies  No results for input(s): CKTOTAL, CKMB, TROPONINI in the last 72 hours. Lab Results  Component Value Date   WBC 9.6 07/05/2021   HGB 13.8 07/05/2021   HCT 40.9 07/05/2021   MCV 92.5 07/05/2021   PLT 178 07/05/2021     Recent Labs  Lab 07/05/21 1633  NA 131*  K 3.8  CL 96*  CO2 17*  BUN 16  CREATININE 0.52*  CALCIUM 9.6  PROT 7.1  BILITOT 0.6  ALKPHOS 73  ALT 64*  AST 93*  GLUCOSE 69*   No results found for: CHOL, HDL, LDLCALC, TRIG No results found for: DDIMER Invalid input(s): POCBNP   COVID-19 Labs No results for input(s): DDIMER, FERRITIN,  LDH, CRP in the last 72 hours. Lab Results  Component Value Date   [place] NEGATIVE 07/05/2021    Radiological Exams on Admission: CT CHEST ABDOMEN PELVIS W CONTRAST  Result Date: 07/05/2021 CLINICAL DATA:  [name] presents for follow-up of suspected pulmonary neoplasm. EXAM: CT CHEST, ABDOMEN, AND PELVIS WITH CONTRAST TECHNIQUE: Multidetector CT imaging of the chest, abdomen and pelvis was performed following the standard protocol during bolus administration of intravenous contrast. CONTRAST:  139mL OMNIPAQUE IOHEXOL 300 MG/ML  SOLN COMPARISON:  Most recent comparison imaging of the chest are more remote chest x-rays. No direct comparison imaging is available with CT. Report from [place] center references a 1.6 x 1.00 cm pulmonary nodule in the LEFT upper lobe. FINDINGS: CT CHEST FINDINGS Cardiovascular: Calcified and noncalcified atheromatous plaque of the thoracic aorta. Normal heart size. Three-vessel coronary artery disease. Mild engorgement of central pulmonary vasculature. Mediastinum/Nodes: Diffuse adenopathy throughout the mediastinum with heterogeneous and necrotic appearance. LEFT paratracheal mass 3.8 x 3.3 cm (image 26/2) AP window lymph node 2.3 cm short axis. RIGHT paratracheal/peribronchial lymph node (image 30/2) 16 mm short axis. Cystic lesion in the anterior mediastinum reported as benign on previous imaging, no comparison available. Pre-vascular lymph node adjacent to a LEFT upper lobe mass (image 20/2) pre-vascular lymph node at 13 mm. No thoracic inlet adenopathy. Is no axillary adenopathy. Lungs/Pleura:  Numerous pulmonary nodules, too numerous to count. Largest in the LEFT upper lobe invading mediastinal fat measuring 3.6 x 3.0 cm, reportedly 1.6 x 1.0 cm on previous imaging assuming this is the same nodule. Central mass, more likely LEFT juxta hilar suprahilar adenopathy 3.0 x 3.4 cm (image 69/3) LEFT upper lobe pulmonary nodule 11 mm (image 73/3) Numerous additional pulmonary nodules on the LEFT and RIGHT. Largest in the LEFT lower lobe 2.6 x 2.3 cm. Largest in the RIGHT lower lobe (image 132/3) 2.1 cm. Airways are patent aside from LEFT upper lobe bronchials which are narrowed, narrowing of central bronchial structures. No sign of pleural effusion. No substantial septal thickening but some subtle nodularity along the fissure in the LEFT chest is noted. Musculoskeletal: Destructive and expansile rib lesion (image 50/3) 2.5 x 2.1 cm involving the LEFT posterior fifth rib. See below for full musculoskeletal details. CT ABDOMEN PELVIS FINDINGS Hepatobiliary: Surface nodularity along the entire surface of the RIGHT hemi liver peripherally and in more since pouch subcentimeter nodules throughout this location example seen on image 74 of series 2. No parenchymal lesion with suspicious features, small cysts suspected in the LEFT hepatic lobe. Lobular hepatic contours with fissural widening. Portal vein is patent. Pancreas: Normal, without mass, inflammation or ductal dilatation. Mass adjacent to the pancreas likely within the lesser sac. Extensive serosal and peritoneal nodularity throughout the abdomen, see below. No pancreatic ductal dilation or sign of inflammation. Spleen: Spleen normal size and contour. Adrenals/Urinary Tract: Signs of adrenal metastases bilaterally. LEFT adrenal mass (image 61/2) 3.8 x 3.6 cm. RIGHT adrenal mass (image 60/2) 2.5 cm short axis. Infiltrative parenchymal involvement of the RIGHT and LEFT kidney, on the RIGHT 6.8 x 5.0 cm and on the LEFT 6.1 cm greatest axial dimension. Surface  nodularity along the renal contour, on the RIGHT, for example 3.1 cm nodule along the upper pole the RIGHT kidney. Additional areas of heterogeneity in the kidneys smaller and more subtle. No hydronephrosis. Signs of nephrolithiasis with branched calculi, largest in the lower pole on the LEFT 2.7 x 1.8 cm. This extends into the infundibulum and peripheral aspect of  the renal sinus. Two smaller calculi in the lower and interpolar LEFT kidney measuring up to 1.6 cm. Smaller calculi seen on the RIGHT largest 9 mm. Stomach/Bowel: Serosal involvement of the gastric antrum (image 69/2) 2.3 cm. Smaller areas of serosal nodularity along the stomach in this location tracking towards the pancreas. No signs of bowel obstruction or acute bowel process in the setting of extensive peritoneal disease Vascular/Lymphatic: Severe vascular disease. 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 Contrast seen distal to this level in the iliac vasculature. Heavily calcified iliac vessels and plicae that this is a chronic process. Bulky retroperitoneal adenopathy (image 69/2) retrocaval lymph node 2.1 cm. LEFT para-aortic adenopathy for example (image 71/2) 1.6 cm. Intra-aortocaval adenopathy (image 85/2) 1.3 cm. Scattered smaller lymph nodes throughout the retroperitoneum. No pelvic adenopathy. Reproductive: Unremarkable by CT. Other: Extensive peritoneal disease and omental nodularity. No ascites. For example anterior to the transverse colon on image 86/2 is a 2.9 cm peritoneal nodule. Surface nodularity in more since pouch along the under surface of the RIGHT hemi liver as described. Omental nodule in the LEFT upper quadrant (image 75/2) 3.2 cm. Smaller nodules too numerous to count throughout the anterior abdomen elsewhere. Musculoskeletal: Destructive rib lesion in the LEFT chest as described. Lucent area in the LEFT inferior pubic ramus with ill-defined enhancement along the margin of the pubic bone  at this level (image 121/2) 3.6 x 1.1 cm. Destructive LEFT acetabular lesion (image 109/2) 2.0 x 1.6 cm. Spinal degenerative changes. IMPRESSION: Metastatic disease throughout the chest, abdomen and pelvis involving lungs, lymph nodes, viscera and bone as discussed. Distribution and appearance would favor pulmonary primary with metastatic disease. Bilateral adrenal and renal involvement. Infiltrative process in the bilateral kidneys could certainly be seen in the setting of pulmonary metastasis. Biopsy may be helpful given the diffuse nature of disease and the presence of bilateral infiltrative renal lesions. Comparison with prior imaging could also be helpful. Bilateral nephrolithiasis. Signs of extensive vascular disease with chronic appearing aorto iliac occlusive disease Three-vessel coronary artery disease. Aortic atherosclerosis. Aortic Atherosclerosis (ICD10-I70.0) and Emphysema (ICD10-J43.9). Electronically Signed   By: Zetta Bills M.D.   On: 07/05/2021 15:41   DG Chest Portable 1 View  Result Date: 07/05/2021 CLINICAL DATA:  Hemoptysis EXAM: PORTABLE CHEST 1 VIEW COMPARISON:  CT from earlier in the same day. FINDINGS: Cardiac shadow is within normal limits. Soft tissue density is noted in the medial aspect of the left apex consistent with the mass lesion seen on recent CT examination. Fullness in the left hilum is again noted. Scattered pulmonary nodules are noted similar to that seen on prior CT. No acute bony abnormality is noted. Postsurgical changes in the cervical spine are noted. IMPRESSION: Changes similar to that seen on recent CT examination. No acute abnormality noted. Electronically Signed   By: Inez Catalina M.D.   On: 07/05/2021 21:24    EKG: Independently reviewed.  None    Assessment/Plan: Principal Problem:   Hemoptysis Active Problems:   Abnormal CT of the chest   COPD (chronic obstructive pulmonary disease) (HCC)   Coronary artery disease   Abnormal LFTs    Nephrolithiasis   Hematuria   Hemoptysis/ abnormal ct chest: Suspect hemoptysis to be related to possible underlying lung cancer.  Type and screen, pulmonary consult and oncology consult.  Clear diet.  PT/INR.  Ct chest shows  Metastatic disease throughout the chest, abdomen and pelvis involving lungs, lymph nodes, viscera and bone as discussed. Distribution and  appearance would favor pulmonary primary with metastatic disease.  Bilateral adrenal and renal involvement. Infiltrative process in the bilateral kidneys could certainly be seen in the setting of pulmonary metastasis. Biopsy may be helpful given the diffuse nature of disease and the presence of bilateral infiltrative renal lesions. Comparison with prior imaging could also be helpful.  Bilateral nephrolithiasis.  Signs of extensive vascular disease with chronic appearing aorto iliac occlusive disease.  Three-vessel coronary artery disease.  Aortic atherosclerosis. Will also start iv ppi therapy.   COPD: Cont with MDI and nebs.  CAD: Stable no chest pain.  Hold statin as pt has mild LFT elevation.   Abnormal LFT'S: D/D statin and mets related.  Hold statin for now.   Nephrolithiasis/ Hematuria: Pt has hematuria and also has recent episode of gross hematuria. Will stop all nsaids and urology consult.    DVT prophylaxis:  SCDs  Code Status:  Full code  Family Communication:  PEYTON, SPENGLER (Spouse)  [phone] (Home Phone)   Disposition Plan:  Home  Consults called:  Hematology/oncology and pulmonology per a.m. team  Admission status: Inpatient   Para Skeans MD [facility] [phone] How to contact the [facility] Attending or Consulting provider [place] or covering provider during after hours [place], for this patient.    Check the care team in [facility] and look for a) attending/consulting [facility] provider listed and b) the [facility] team listed Log into [email or web address] and use [facility] universal password  to access. If you do not have the password, please contact the hospital operator. Locate the [facility] provider you are looking for under [facility] and page to a number that you can be directly reached. If you still have difficulty reaching the provider, please page the [facility] (Director on Call) for the Hospitalists listed on amion for assistance. [email or web address] Password [facility] 07/05/2021, 10:42 PM

## 2021-07-05 NOTE — Telephone Encounter (Signed)
Call from [place] reporting that patient has been vomiting dark blood today, 6 times since he got home at 1230 after his CT scan Denies nausea, just vomiting. He is not dizzy or light headed. I recommended that since he has vomited dark blood 6 times in less that 3 hours that he go to the ER and he agreed to go. I also called radiology since his CT has not yet been read to have them get study read and call us with results

## 2021-07-05 NOTE — ED Triage Notes (Signed)
Pt comes into the ED via POV c/o vomiting blood.  Pt states he had a CT scan with contrast to check him for cancer, when after the CT scan he started having a lot of coughing and vomited blood.  Pt is being evaluated for possible lung cancer.  Pt states the vomiting of blood started an hour ago and it was dark red in color.  Pt also has a h/o Lupus.  Pt denies that there was in coffee ground look to the emesis, and per the wife the vomit looked more as though they had clots in them.  Pt currently in NAD at this time with even and unlabored respirations.

## 2021-07-05 NOTE — ED Provider Notes (Signed)
[facility] Emergency Department Provider Note    Event Date/Time   First MD Initiated Contact with Patient 07/05/21 2038     (approximate)  I have reviewed the triage vital signs and the nursing notes.   HISTORY  Chief Complaint Hematemesis    HPI Frederick Mitchell is a 65 y.o. male no significant past medical history presents to the ER due to several episodes of coughing up blood clots in streaky blood that occurred today.  Patient had outpatient CT chest abdomen pelvis ordered due to concern for malignancy which shows evidence of suspected primary lung cancer with diffuse metastatic disease.  He is not on any blood thinners.  Denies any fevers or chills.  Does have some mild shortness of breath.  States that he did cough up some large dark clots.  Happened about 6 or 7 times.  Denies any abdominal pain.  Past Medical History:  Diagnosis Date   Deaf    Hypercholesteremia unk   Family History  Problem Relation Age of Onset   Cancer Father    Cancer Sister    Cancer Maternal Uncle    Cancer Paternal Aunt    Cancer Maternal Grandmother    Cancer Maternal Grandfather    Past Surgical History:  Procedure Laterality Date   EYE SURGERY     Patient Active Problem List   Diagnosis Date Noted   Coronary artery disease 07/05/2021   Hemoptysis 07/05/2021   Abnormal CT of the chest 07/05/2021   Hematuria 07/05/2021   Nephrolithiasis 07/05/2021   Abnormal LFTs 07/05/2021   COPD (chronic obstructive pulmonary disease) ([place]) 03/23/2013      Prior to Admission medications   Medication Sig Start Date End Date Taking? Authorizing Provider  atorvastatin (LIPITOR) 80 MG tablet Take 80 mg by mouth daily. 05/31/21 05/31/22 Yes [provider]  carboxymethylcellulose (REFRESH PLUS) 0.5 % SOLN Apply to eye.   Yes [provider]  clobetasol (TEMOVATE) 0.05 % external solution Apply 1 application topically 2 (two) times daily. 06/17/21  Yes  [provider]  doxepin (SINEQUAN) 50 MG capsule Take 50 mg by mouth at bedtime.   Yes [provider]  fluticasone (FLONASE) 50 MCG/ACT nasal spray SHAKE LIQUID AND USE 2 SPRAYS IN EACH NOSTRIL EVERY DAY 11/17/19  Yes [provider]  Fluticasone-Umeclidin-Vilant (TRELEGY ELLIPTA) 100-62.5-25 MCG/ACT AEPB Inhale 2 sprays into the lungs 2 (two) times daily. 01/11/20  Yes [provider]  hydroxychloroquine (PLAQUENIL) 200 MG tablet Take 200 mg by mouth 2 (two) times daily. 06/11/21  Yes [provider]  hydrOXYzine (ATARAX) 10 MG tablet Take 10 mg by mouth 3 (three) times daily as needed. 06/25/21 06/25/22 Yes [provider]  predniSONE (DELTASONE) 10 MG tablet Take 10 mg by mouth daily. 01/16/21  Yes [provider]  triamcinolone (KENALOG) 0.025 % cream Apply 1 application topically 3 (three) times daily.   Yes [provider]  Vitamin D, Ergocalciferol, (DRISDOL) 1.25 MG (50000 UNIT) CAPS capsule Take 50,000 Units by mouth 3 (three) times a week. 05/16/21  Yes [provider]  aspirin 81 MG EC tablet Take 81 mg by mouth daily. Patient not taking: Reported on 07/05/2021 06/25/21 06/25/22  [provider]  cephALEXin (KEFLEX) 250 MG capsule cephalexin 250 mg capsule Patient not taking: Reported on 07/05/2021    [provider]  ibuprofen (ADVIL,MOTRIN) 800 MG tablet Take 1 tablet (800 mg total) by mouth every 8 (eight) hours as needed for moderate pain.  Patient not taking: Reported on 07/05/2021 01/18/15   Sable Feil, PA-C  meloxicam (MOBIC) 15 MG tablet Take 15 mg by mouth. Patient not taking: Reported on 07/05/2021 05/31/21 05/31/22  [provider]  naproxen (EC NAPROSYN) 500 MG EC tablet naproxen 500 mg tablet,delayed release Patient not taking: Reported on 07/05/2021    [provider]    Allergies Patient has no known allergies.    Social History Social History    Tobacco Use   Smoking status: Every Day    Types: Cigarettes   Smokeless tobacco: Never  Substance Use Topics   Alcohol use: No   Drug use: Not Currently    Review of Systems Patient denies headaches, rhinorrhea, blurry vision, numbness, shortness of breath, chest pain, edema, cough, abdominal pain, nausea, vomiting, diarrhea, dysuria, fevers, rashes or hallucinations unless otherwise stated above in HPI. ____________________________________________   PHYSICAL EXAM:  VITAL SIGNS: Vitals:   07/05/21 2100 07/05/21 2130  BP: 121/66 127/68  Pulse: 78 85  Resp:  19  Temp:    SpO2: 93% 93%    Constitutional: Alert and oriented.  Eyes: Conjunctivae are normal.  Head: Atraumatic. Nose: No congestion/rhinnorhea. Mouth/Throat: Mucous membranes are moist.   Neck: No stridor. Painless ROM.  Cardiovascular: Normal rate, regular rhythm. Grossly normal heart sounds.  Good peripheral circulation. Respiratory: Normal respiratory effort.  No retractions. Lungs with coarse bs throughout Gastrointestinal: Soft and nontender. No distention. No abdominal bruits. No CVA tenderness. Genitourinary:  Musculoskeletal: No lower extremity tenderness nor edema.  No joint effusions. Neurologic:  Normal speech and language. No gross focal neurologic deficits are appreciated. No facial droop Skin:  Skin is warm, dry and intact. No rash noted. Psychiatric: Mood and affect are normal. Speech and behavior are normal.  ____________________________________________   LABS (all labs ordered are listed, but only abnormal results are displayed)  Results for orders placed or performed during the hospital encounter of 07/05/21 (from the past 24 hour(s))  Lipase, blood     Status: None   Collection Time: 07/05/21  4:33 PM  Result Value Ref Range   Lipase 45 11 - 51 U/L  Comprehensive metabolic panel     Status: Abnormal   Collection Time: 07/05/21  4:33 PM  Result Value Ref Range   Sodium 131 (L) 135 -  145 mmol/L   Potassium 3.8 3.5 - 5.1 mmol/L   Chloride 96 (L) 98 - 111 mmol/L   CO2 17 (L) 22 - 32 mmol/L   Glucose, Bld 69 (L) 70 - 99 mg/dL   BUN 16 8 - 23 mg/dL   Creatinine, Ser 0.52 (L) 0.61 - 1.24 mg/dL   Calcium 9.6 8.9 - 10.3 mg/dL   Total Protein 7.1 6.5 - 8.1 g/dL   Albumin 3.4 (L) 3.5 - 5.0 g/dL   AST 93 (H) 15 - 41 U/L   ALT 64 (H) 0 - 44 U/L   Alkaline Phosphatase 73 38 - 126 U/L   Total Bilirubin 0.6 0.3 - 1.2 mg/dL   GFR, Estimated >60 >60 mL/min   Anion gap 18 (H) 5 - 15  CBC     Status: None   Collection Time: 07/05/21  4:33 PM  Result Value Ref Range   WBC 9.6 4.0 - 10.5 K/uL   RBC 4.42 4.22 - 5.81 MIL/uL   Hemoglobin 13.8 13.0 - 17.0 g/dL   HCT 40.9 39.0 - 52.0 %   MCV 92.5 80.0 - 100.0 fL   MCH 31.2 26.0 - 34.0  pg   MCHC 33.7 30.0 - 36.0 g/dL   RDW 14.6 11.5 - 15.5 %   Platelets 178 150 - 400 K/uL   nRBC 0.0 0.0 - 0.2 %  Urinalysis, Routine w reflex microscopic     Status: Abnormal   Collection Time: 07/05/21  4:33 PM  Result Value Ref Range   Color, Urine YELLOW YELLOW   APPearance CLEAR CLEAR   Specific Gravity, Urine 1.010 1.005 - 1.030   pH 6.5 5.0 - 8.0   Glucose, UA NEGATIVE NEGATIVE mg/dL   Hgb urine dipstick MODERATE (A) NEGATIVE   Bilirubin Urine NEGATIVE NEGATIVE   Ketones, ur NEGATIVE NEGATIVE mg/dL   Protein, ur NEGATIVE NEGATIVE mg/dL   Nitrite NEGATIVE NEGATIVE   Leukocytes,Ua NEGATIVE NEGATIVE  Urinalysis, Microscopic (reflex)     Status: None   Collection Time: 07/05/21  4:33 PM  Result Value Ref Range   RBC / HPF 0-5 0 - 5 RBC/hpf   WBC, UA 0-5 0 - 5 WBC/hpf   Bacteria, UA NONE SEEN NONE SEEN   Squamous Epithelial / LPF 0-5 0 - 5  Resp Panel by RT-PCR (Flu A&B, Covid) Nasopharyngeal Swab     Status: None   Collection Time: 07/05/21  9:09 PM   Specimen: Nasopharyngeal Swab; Nasopharyngeal(NP) swabs in vial transport medium  Result Value Ref Range   SARS Coronavirus 2 by RT PCR NEGATIVE NEGATIVE   Influenza A by PCR NEGATIVE  NEGATIVE   Influenza B by PCR NEGATIVE NEGATIVE  Type and screen [facility]     Status: None (Preliminary result)   Collection Time: 07/05/21  9:09 PM  Result Value Ref Range   ABO/RH(D) PENDING    Antibody Screen PENDING    Sample Expiration      07/08/2021,2359 Performed at [facility], [address] [place], [place] [address]   Protime-INR     Status: None   Collection Time: 07/05/21  9:43 PM  Result Value Ref Range   Prothrombin Time 12.5 11.4 - 15.2 seconds   INR 0.9 0.8 - 1.2  Type and screen Ordered by PROVIDER DEFAULT     Status: None   Collection Time: 07/05/21 10:20 PM  Result Value Ref Range   ABO/RH(D) A POS    Antibody Screen NEG    Sample Expiration      07/08/2021,2359 Performed at [facility], [address] [place], [place] [address]    ____________________________________________  EKG____________________________________________  RADIOLOGY  I personally reviewed all radiographic images ordered to evaluate for the above acute complaints and reviewed radiology reports and findings.  These findings were personally discussed with the patient.  Please see medical record for radiology report.  ____________________________________________   PROCEDURES  Procedure(s) performed:  Procedures    Critical Care performed: no ____________________________________________   INITIAL IMPRESSION / ASSESSMENT AND PLAN / ED COURSE  Pertinent labs & imaging results that were available during my care of the patient were reviewed by me and considered in my medical decision making (see chart for details).   DDX: mass, bronchitis, pna, PE, chf  ESTES LEHNER is a 65 y.o. who presents to the ED with chief complaint of hemoptysis as described above.  He is hemodynamically stable.  CT imaging concerning for diffuse metastatic disease with primary lung cancer.  His hemoglobin is stable at this time.  Will discuss with hospitalist  for admission and further management.  Patient agreeable to plan.     The patient was evaluated  in Emergency Department today for the symptoms described in the history of present illness. He/she was evaluated in the context of the global COVID-19 pandemic, which necessitated consideration that the patient might be at risk for infection with the SARS-CoV-2 virus that causes COVID-19. Institutional protocols and algorithms that pertain to the evaluation of patients at risk for COVID-19 are in a state of rapid change based on information released by regulatory bodies including the CDC and federal and state organizations. These policies and algorithms were followed during the patient's care in the ED.  As part of my medical decision making, I reviewed the following data within the [place] notes reviewed and incorporated, Labs reviewed, notes from prior ED visits and [place] Controlled Substance Database   ____________________________________________   FINAL CLINICAL IMPRESSION(S) / ED DIAGNOSES  Final diagnoses:  Hemoptysis      NEW MEDICATIONS STARTED DURING THIS VISIT:  New Prescriptions   No medications on file     Note:  This document was prepared using Dragon voice recognition software and may include unintentional dictation errors.    Merlyn Lot, MD 07/05/21 [phone]

## 2021-07-06 ENCOUNTER — Telehealth: Payer: Self-pay | Admitting: Oncology

## 2021-07-06 DIAGNOSIS — R9389 Abnormal findings on diagnostic imaging of other specified body structures: Secondary | ICD-10-CM | POA: Diagnosis not present

## 2021-07-06 DIAGNOSIS — R042 Hemoptysis: Secondary | ICD-10-CM | POA: Diagnosis not present

## 2021-07-06 LAB — COMPREHENSIVE METABOLIC PANEL
ALT: 55 U/L — ABNORMAL HIGH (ref 0–44)
AST: 80 U/L — ABNORMAL HIGH (ref 15–41)
Albumin: 2.9 g/dL — ABNORMAL LOW (ref 3.5–5.0)
Alkaline Phosphatase: 69 U/L (ref 38–126)
Anion gap: 7 (ref 5–15)
BUN: 14 mg/dL (ref 8–23)
CO2: 26 mmol/L (ref 22–32)
Calcium: 8.9 mg/dL (ref 8.9–10.3)
Chloride: 98 mmol/L (ref 98–111)
Creatinine, Ser: 0.6 mg/dL — ABNORMAL LOW (ref 0.61–1.24)
GFR, Estimated: >60 mL/min (ref >60)
Glucose, Bld: 80 mg/dL (ref 70–99)
Potassium: 3.9 mmol/L (ref 3.5–5.1)
Sodium: 131 mmol/L — ABNORMAL LOW (ref 135–145)
Total Bilirubin: 0.7 mg/dL (ref 0.3–1.2)
Total Protein: 6.6 g/dL (ref 6.5–8.1)

## 2021-07-06 LAB — CBC
HCT: 39.1 % (ref 39.0–52.0)
Hemoglobin: 13.1 g/dL (ref 13.0–17.0)
MCH: 31.2 pg (ref 26.0–34.0)
MCHC: 33.5 g/dL (ref 30.0–36.0)
MCV: 93.1 fL (ref 80.0–100.0)
Platelets: 167 10*3/uL (ref 150–400)
RBC: 4.2 MIL/uL — ABNORMAL LOW (ref 4.22–5.81)
RDW: 14.9 % (ref 11.5–15.5)
WBC: 8.9 10*3/uL (ref 4.0–10.5)
nRBC: 0 % (ref 0.0–0.2)

## 2021-07-06 LAB — HIV ANTIBODY (ROUTINE TESTING W REFLEX): HIV Screen 4th Generation wRfx: NONREACTIVE

## 2021-07-06 LAB — HEMOGLOBIN AND HEMATOCRIT, BLOOD
HCT: 39.5 % (ref 39.0–52.0)
Hemoglobin: 13.3 g/dL (ref 13.0–17.0)

## 2021-07-06 MED ORDER — PREDNISONE 20 MG PO TABS
10.0000 mg | ORAL_TABLET | Freq: Every day | ORAL | Status: DC
  Administered 2021-07-06 – 2021-07-10 (×5): 10 mg via ORAL
  Filled 2021-07-06 (×5): qty 1

## 2021-07-06 NOTE — Plan of Care (Signed)

## 2021-07-06 NOTE — Progress Notes (Signed)
Melissa Riely, sister in law, requesting MD update to clarify plan of care for pt and wife, who are both deaf, as Lenna Sciara is not deaf so she is requesting a phone call with Nolberto Hanlon, MD. Cell phone number provided [phone] and given to Dr. Kurtis Bushman. Nolberto Hanlon, MD aware.

## 2021-07-06 NOTE — Progress Notes (Addendum)
Patient having episodes of hemoptysis x2 - informed Hospitalist

## 2021-07-06 NOTE — Plan of Care (Signed)
°  Problem: Education: °Goal: Knowledge of General Education information will improve °Description: Including pain rating scale, medication(s)/side effects and non-pharmacologic comfort measures °Outcome: Progressing °  °Problem: Health Behavior/Discharge Planning: °Goal: Ability to manage health-related needs will improve °Outcome: Progressing °  °Problem: Clinical Measurements: °Goal: Ability to maintain clinical measurements within normal limits will improve °Outcome: Progressing °Goal: Respiratory complications will improve °Outcome: Progressing °  °Problem: Activity: °Goal: Risk for activity intolerance will decrease °Outcome: Progressing °  °Problem: Pain Managment: °Goal: General experience of comfort will improve °Outcome: Progressing °  °Problem: Safety: °Goal: Ability to remain free from injury will improve °Outcome: Progressing °  °Problem: Skin Integrity: °Goal: Risk for impaired skin integrity will decrease °Outcome: Progressing °  °

## 2021-07-06 NOTE — Progress Notes (Addendum)
PROGRESS NOTE    Frederick Mitchell  IRC:[id number] DOB: May 29, 1956 DOA: 07/05/2021 PCP: Gayland Curry, MD    Brief Narrative:  Patient with diagnosis of lung cancer presents with 8 episodes of coughing up blood with blood-streaked sputum x8 yesterday. Takes NSAIDs prn. None last week, only took tylenol.   12/9 used interpreter as pt uses sign language   Consultants:   oncology  Procedures:   Antimicrobials:      Subjective: No further hemoptysis. No sob, cp, dizziness  Objective: Vitals:   07/05/21 2130 07/06/21 0037 07/06/21 0310 07/06/21 0712  BP: 127/68 117/73 112/62 111/64  Pulse: 85 81 77 76  Resp: 19 17 18 18   Temp:  (!) 97.3 F (36.3 C) 97.7 F (36.5 C) 97.9 F (36.6 C)  TempSrc:   Oral Oral  SpO2: 93% 96% 97% 96%  Weight:      Height:        Intake/Output Summary (Last 24 hours) at 07/06/2021 1406 Last data filed at 07/06/2021 0700 Gross per 24 hour  Intake 0 ml  Output 650 ml  Net -650 ml   Filed Weights   07/05/21 1631  Weight: 73.5 kg    Examination:  General exam: Appears calm and comfortable  Respiratory system: Clear to auscultation. Respiratory effort normal. Cardiovascular system: S1 & S2 heard, RRR. No JVD, murmurs, rubs, gallops or clicks. Gastrointestinal system: Abdomen is nondistended, soft and nontender. Normal bowel sounds heard. Central nervous system: Alert and oriented. Grossly intact. deaf Extremities: no edema Psychiatry:  Mood & affect appropriate.     Data Reviewed: I have personally reviewed following labs and imaging studies  CBC: Recent Labs  Lab 07/05/21 1633 07/06/21 0214  WBC 9.6 8.9  HGB 13.8 13.1  HCT 40.9 39.1  MCV 92.5 93.1  PLT 178 510   Basic Metabolic Panel: Recent Labs  Lab 07/05/21 1633  NA 131*  K 3.8  CL 96*  CO2 17*  GLUCOSE 69*  BUN 16  CREATININE 0.52*  CALCIUM 9.6   GFR: Estimated Creatinine Clearance: 83.8 mL/min (A) (by C-G formula based on SCr of 0.52 mg/dL (L)). Liver  Function Tests: Recent Labs  Lab 07/05/21 1633  AST 93*  ALT 64*  ALKPHOS 73  BILITOT 0.6  PROT 7.1  ALBUMIN 3.4*   Recent Labs  Lab 07/05/21 1633  LIPASE 45   No results for input(s): AMMONIA in the last 168 hours. Coagulation Profile: Recent Labs  Lab 07/05/21 2143  INR 0.9   Cardiac Enzymes: No results for input(s): CKTOTAL, CKMB, CKMBINDEX, TROPONINI in the last 168 hours. BNP (last 3 results) No results for input(s): PROBNP in the last 8760 hours. HbA1C: No results for input(s): HGBA1C in the last 72 hours. CBG: No results for input(s): GLUCAP in the last 168 hours. Lipid Profile: No results for input(s): CHOL, HDL, LDLCALC, TRIG, CHOLHDL, LDLDIRECT in the last 72 hours. Thyroid Function Tests: No results for input(s): TSH, T4TOTAL, FREET4, T3FREE, THYROIDAB in the last 72 hours. Anemia Panel: No results for input(s): VITAMINB12, FOLATE, FERRITIN, TIBC, IRON, RETICCTPCT in the last 72 hours. Sepsis Labs: No results for input(s): PROCALCITON, LATICACIDVEN in the last 168 hours.  Recent Results (from the past 240 hour(s))  Resp Panel by RT-PCR (Flu A&B, Covid) Nasopharyngeal Swab     Status: None   Collection Time: 07/05/21  9:09 PM   Specimen: Nasopharyngeal Swab; Nasopharyngeal(NP) swabs in vial transport medium  Result Value Ref Range Status   SARS Coronavirus 2 by RT  PCR NEGATIVE NEGATIVE Final    Comment: (NOTE) SARS-CoV-2 target nucleic acids are NOT DETECTED.  The SARS-CoV-2 RNA is generally detectable in upper respiratory specimens during the acute phase of infection. The lowest concentration of SARS-CoV-2 viral copies this assay can detect is 138 copies/mL. A negative result does not preclude SARS-Cov-2 infection and should not be used as the sole basis for treatment or other patient management decisions. A negative result may occur with  improper specimen collection/handling, submission of specimen other than nasopharyngeal swab, presence of viral  mutation(s) within the areas targeted by this assay, and inadequate number of viral copies(<138 copies/mL). A negative result must be combined with clinical observations, patient history, and epidemiological information. The expected result is Negative.  Fact Sheet for Patients:  EntrepreneurPulse.com.au  Fact Sheet for Healthcare Providers:  IncredibleEmployment.be  This test is no t yet approved or cleared by the [place] FDA and  has been authorized for detection and/or diagnosis of SARS-CoV-2 by FDA under an Emergency Use Authorization (EUA). This EUA will remain  in effect (meaning this test can be used) for the duration of the COVID-19 declaration under Section 564(b)(1) of the Act, 21 U.S.C.section 360bbb-3(b)(1), unless the authorization is terminated  or revoked sooner.       Influenza A by PCR NEGATIVE NEGATIVE Final   Influenza B by PCR NEGATIVE NEGATIVE Final    Comment: (NOTE) The Xpert Xpress SARS-CoV-2/FLU/RSV plus assay is intended as an aid in the diagnosis of influenza from Nasopharyngeal swab specimens and should not be used as a sole basis for treatment. Nasal washings and aspirates are unacceptable for Xpert Xpress SARS-CoV-2/FLU/RSV testing.  Fact Sheet for Patients: EntrepreneurPulse.com.au  Fact Sheet for Healthcare Providers: IncredibleEmployment.be  This test is not yet approved or cleared by the [place] FDA and has been authorized for detection and/or diagnosis of SARS-CoV-2 by FDA under an Emergency Use Authorization (EUA). This EUA will remain in effect (meaning this test can be used) for the duration of the COVID-19 declaration under Section 564(b)(1) of the Act, 21 U.S.C. section 360bbb-3(b)(1), unless the authorization is terminated or revoked.  Performed at [facility], [address], [place], [place] [address]          Radiology  Studies: CT CHEST ABDOMEN PELVIS W CONTRAST  Result Date: 07/05/2021 CLINICAL DATA:  [name] presents for follow-up of suspected pulmonary neoplasm. EXAM: CT CHEST, ABDOMEN, AND PELVIS WITH CONTRAST TECHNIQUE: Multidetector CT imaging of the chest, abdomen and pelvis was performed following the standard protocol during bolus administration of intravenous contrast. CONTRAST:  19mL OMNIPAQUE IOHEXOL 300 MG/ML  SOLN COMPARISON:  Most recent comparison imaging of the chest are more remote chest x-rays. No direct comparison imaging is available with CT. Report from [place] center references a 1.6 x 1.00 cm pulmonary nodule in the LEFT upper lobe. FINDINGS: CT CHEST FINDINGS Cardiovascular: Calcified and noncalcified atheromatous plaque of the thoracic aorta. Normal heart size. Three-vessel coronary artery disease. Mild engorgement of central pulmonary vasculature. Mediastinum/Nodes: Diffuse adenopathy throughout the mediastinum with heterogeneous and necrotic appearance. LEFT paratracheal mass 3.8 x 3.3 cm (image 26/2) AP window lymph node 2.3 cm short axis. RIGHT paratracheal/peribronchial lymph node (image 30/2) 16 mm short axis. Cystic lesion in the anterior mediastinum reported as benign on previous imaging, no comparison available. Pre-vascular lymph node adjacent to a LEFT upper lobe mass (image 20/2) pre-vascular lymph node at 13 mm. No thoracic inlet adenopathy. Is no axillary adenopathy. Lungs/Pleura: Numerous pulmonary  nodules, too numerous to count. Largest in the LEFT upper lobe invading mediastinal fat measuring 3.6 x 3.0 cm, reportedly 1.6 x 1.0 cm on previous imaging assuming this is the same nodule. Central mass, more likely LEFT juxta hilar suprahilar adenopathy 3.0 x 3.4 cm (image 69/3) LEFT upper lobe pulmonary nodule 11 mm (image 73/3) Numerous additional pulmonary nodules on the LEFT and RIGHT. Largest in the LEFT lower lobe 2.6 x 2.3 cm. Largest in the RIGHT lower lobe  (image 132/3) 2.1 cm. Airways are patent aside from LEFT upper lobe bronchials which are narrowed, narrowing of central bronchial structures. No sign of pleural effusion. No substantial septal thickening but some subtle nodularity along the fissure in the LEFT chest is noted. Musculoskeletal: Destructive and expansile rib lesion (image 50/3) 2.5 x 2.1 cm involving the LEFT posterior fifth rib. See below for full musculoskeletal details. CT ABDOMEN PELVIS FINDINGS Hepatobiliary: Surface nodularity along the entire surface of the RIGHT hemi liver peripherally and in more since pouch subcentimeter nodules throughout this location example seen on image 74 of series 2. No parenchymal lesion with suspicious features, small cysts suspected in the LEFT hepatic lobe. Lobular hepatic contours with fissural widening. Portal vein is patent. Pancreas: Normal, without mass, inflammation or ductal dilatation. Mass adjacent to the pancreas likely within the lesser sac. Extensive serosal and peritoneal nodularity throughout the abdomen, see below. No pancreatic ductal dilation or sign of inflammation. Spleen: Spleen normal size and contour. Adrenals/Urinary Tract: Signs of adrenal metastases bilaterally. LEFT adrenal mass (image 61/2) 3.8 x 3.6 cm. RIGHT adrenal mass (image 60/2) 2.5 cm short axis. Infiltrative parenchymal involvement of the RIGHT and LEFT kidney, on the RIGHT 6.8 x 5.0 cm and on the LEFT 6.1 cm greatest axial dimension. Surface nodularity along the renal contour, on the RIGHT, for example 3.1 cm nodule along the upper pole the RIGHT kidney. Additional areas of heterogeneity in the kidneys smaller and more subtle. No hydronephrosis. Signs of nephrolithiasis with branched calculi, largest in the lower pole on the LEFT 2.7 x 1.8 cm. This extends into the infundibulum and peripheral aspect of the renal sinus. Two smaller calculi in the lower and interpolar LEFT kidney measuring up to 1.6 cm. Smaller calculi seen on the  RIGHT largest 9 mm. Stomach/Bowel: Serosal involvement of the gastric antrum (image 69/2) 2.3 cm. Smaller areas of serosal nodularity along the stomach in this location tracking towards the pancreas. No signs of bowel obstruction or acute bowel process in the setting of extensive peritoneal disease Vascular/Lymphatic: Severe vascular disease. 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 Contrast seen distal to this level in the iliac vasculature. Heavily calcified iliac vessels and plicae that this is a chronic process. Bulky retroperitoneal adenopathy (image 69/2) retrocaval lymph node 2.1 cm. LEFT para-aortic adenopathy for example (image 71/2) 1.6 cm. Intra-aortocaval adenopathy (image 85/2) 1.3 cm. Scattered smaller lymph nodes throughout the retroperitoneum. No pelvic adenopathy. Reproductive: Unremarkable by CT. Other: Extensive peritoneal disease and omental nodularity. No ascites. For example anterior to the transverse colon on image 86/2 is a 2.9 cm peritoneal nodule. Surface nodularity in more since pouch along the under surface of the RIGHT hemi liver as described. Omental nodule in the LEFT upper quadrant (image 75/2) 3.2 cm. Smaller nodules too numerous to count throughout the anterior abdomen elsewhere. Musculoskeletal: Destructive rib lesion in the LEFT chest as described. Lucent area in the LEFT inferior pubic ramus with ill-defined enhancement along the margin of the pubic bone  at this level (image 121/2) 3.6 x 1.1 cm. Destructive LEFT acetabular lesion (image 109/2) 2.0 x 1.6 cm. Spinal degenerative changes. IMPRESSION: Metastatic disease throughout the chest, abdomen and pelvis involving lungs, lymph nodes, viscera and bone as discussed. Distribution and appearance would favor pulmonary primary with metastatic disease. Bilateral adrenal and renal involvement. Infiltrative process in the bilateral kidneys could certainly be seen in the setting of pulmonary  metastasis. Biopsy may be helpful given the diffuse nature of disease and the presence of bilateral infiltrative renal lesions. Comparison with prior imaging could also be helpful. Bilateral nephrolithiasis. Signs of extensive vascular disease with chronic appearing aorto iliac occlusive disease Three-vessel coronary artery disease. Aortic atherosclerosis. Aortic Atherosclerosis (ICD10-I70.0) and Emphysema (ICD10-J43.9). Electronically Signed   By: Zetta Bills M.D.   On: 07/05/2021 15:41   DG Chest Portable 1 View  Result Date: 07/05/2021 CLINICAL DATA:  Hemoptysis EXAM: PORTABLE CHEST 1 VIEW COMPARISON:  CT from earlier in the same day. FINDINGS: Cardiac shadow is within normal limits. Soft tissue density is noted in the medial aspect of the left apex consistent with the mass lesion seen on recent CT examination. Fullness in the left hilum is again noted. Scattered pulmonary nodules are noted similar to that seen on prior CT. No acute bony abnormality is noted. Postsurgical changes in the cervical spine are noted. IMPRESSION: Changes similar to that seen on recent CT examination. No acute abnormality noted. Electronically Signed   By: Inez Catalina M.D.   On: 07/05/2021 21:24        Scheduled Meds:  doxepin  50 mg Oral QHS   hydroxychloroquine  200 mg Oral BID   nicotine  21 mg Transdermal Daily   pantoprazole (PROTONIX) IV  40 mg Intravenous Q12H   predniSONE  10 mg Oral Q breakfast   sodium chloride flush  3 mL Intravenous Q12H   Continuous Infusions:  sodium chloride     sodium chloride 75 mL/hr at 07/06/21 0124    Assessment & Plan:   Principal Problem:   Hemoptysis Active Problems:   COPD (chronic obstructive pulmonary disease) (HCC)   Coronary artery disease   Abnormal CT of the chest   Hematuria   Nephrolithiasis   Abnormal LFTs   1.Hemoptysis Likely due to hemoptysis CT with pulmonary primary with metastatic disease see full report H/h stable Resolved.   Monitor Continue with ppi   2. PVD/CAD  Found on CT Will need to f/u with cardiology as outpatient  3. Lung ca with mets Consulted oncology, will f/u   4.COPD Without exacerbation  5. Elevated LFT Etiology unclear. Possibly due to statin and ?mets. Holding statin Repeat labs   6.hematuria None reported. H/h stable UA neg for infection .  Needs to see urology as outpt Avoid nsaids   7.hyponatremia Mild Possibly due to lung ca and hypovolemia On ivf Ck labs    DVT prophylaxis: scd Code Status: Full Family Communication: Wife at bedside Disposition Plan:  Status is: Inpatient  Remains inpatient appropriate because: Due to severity of illness requiring hospitalization.  Oncology consulted pending.  Also receiving IV treatment            LOS: 1 day   Time spent: 45 min with >50% on coc    Nolberto Hanlon, MD [facility] Pager [phone]  If 7PM-7AM, please contact night-coverage 07/06/2021, 2:06 PM

## 2021-07-06 NOTE — Progress Notes (Signed)
Nutrition Brief Note  Patient identified on the Malnutrition Screening Tool (MST) Report  Wt Readings from Last 15 Encounters:  07/05/21 73.5 kg  06/15/21 73.5 kg  11/25/19 87.1 kg  01/18/15 86.2 kg   Frederick Mitchell is a 65 y.o. male seen in ed with complaints of coughing up blood with blood streaking sputum that is been going on multiple episodes at home today.  Patient is currently under investigation for suspected Lung cancer with oncologist visit on 06/15/2021 reviewed.  Chart review does show patient is taking Mobic and naproxen and motrin.  Pt admitted with hemoptysis.   Reviewed I/O's: -650 ml x 24 hours  UOP: 650 ml x 24 hours  Spoke with pt, wife, and nephew at bedside. [employer] interpeter not available at time of visit, so used pen and paper to communicate. Pt reports good appetite and denies any weight loss. Noted meal completions 75%. Visit cut short due to MD need to assess pt.   Nutrition-Focused physical exam completed. Findings are no fat depletion, no muscle depletion, and no edema.     Labs reviewed: Na: 131.    Current diet order is regular, patient is consuming approximately 75% of meals at this time. Labs and medications reviewed.   No nutrition interventions warranted at this time. If nutrition issues arise, please consult RD.   Loistine Chance, RD, LDN, [place] Registered Dietitian II Certified Diabetes Care and Education Specialist Please refer to [facility] for RD and/or RD on-call/weekend/after hours pager

## 2021-07-06 NOTE — Telephone Encounter (Signed)
Sister of pt called to inform us that pt was admitted to the hospital today. She wants to talk to the RN,Sherry. Her # is [phone]

## 2021-07-06 NOTE — Consult Note (Signed)
Hematology/Oncology Consult note [phone] [phone]  Patient Care Team: Gayland Curry, MD as PCP - General (Family Medicine) Telford Nab, RN as Oncology Nurse Navigator   Name of the patient: Frederick Mitchell  [id number]  04-29-1956   Date of visit: 07/06/21 REASON FOR COSULTATION:  Metastatic cancer History of presenting illness-  65 y.o. male with PMH listed at below who presents to ER for evaluation of coughing up blood with clots. I am covering Dr.Rao to see this patient. Patient had outpatient CT chest abdomen pelvis done for concern of malignancy work-up. Denies any fever or chills.  He did have mild shortness of breath. Wife is at bedside.  Both patient and her wife are deaf. Online sign language interpreter was utilized for the entire encounter. Hemoptysis episodes have resolved since admission.  At presentation, hemoglobin was 13.8 He denies pain. + unintentional weight loss + extensive smoking history  Review of Systems  Constitutional:  Positive for fatigue. Negative for appetite change, chills, fever and unexpected weight change.  HENT:   Negative for hearing loss and voice change.   Eyes:  Negative for eye problems and icterus.  Respiratory:  Positive for hemoptysis and shortness of breath. Negative for chest tightness and cough.   Cardiovascular:  Negative for chest pain and leg swelling.  Gastrointestinal:  Negative for abdominal distention and abdominal pain.  Endocrine: Negative for hot flashes.  Genitourinary:  Negative for difficulty urinating, dysuria and frequency.   Musculoskeletal:  Negative for arthralgias.  Skin:  Negative for itching and rash.  Neurological:  Negative for light-headedness and numbness.  Hematological:  Negative for adenopathy. Does not bruise/bleed easily.  Psychiatric/Behavioral:  Negative for confusion.    No Known Allergies  Patient Active Problem List   Diagnosis Date Noted   Coronary artery disease  07/05/2021   Hemoptysis 07/05/2021   Abnormal CT of the chest 07/05/2021   Hematuria 07/05/2021   Nephrolithiasis 07/05/2021   Abnormal LFTs 07/05/2021   COPD (chronic obstructive pulmonary disease) ([place]) 03/23/2013     Past Medical History:  Diagnosis Date   Deaf    Hypercholesteremia unk     Past Surgical History:  Procedure Laterality Date   EYE SURGERY      Social History   Socioeconomic History   Marital status: Married    Spouse name: Not on file   Number of children: Not on file   Years of education: Not on file   Highest education level: Not on file  Occupational History   Not on file  Tobacco Use   Smoking status: Every Day    Types: Cigarettes   Smokeless tobacco: Never  Substance and Sexual Activity   Alcohol use: No   Drug use: Not Currently   Sexual activity: Not Currently    Birth control/protection: None  Other Topics Concern   Not on file  Social History Narrative   Not on file   Social Determinants of Health   Financial Resource Strain: Not on file  Food Insecurity: Not on file  Transportation Needs: Not on file  Physical Activity: Not on file  Stress: Not on file  Social Connections: Not on file  Intimate Partner Violence: Not on file     Family History  Problem Relation Age of Onset   Cancer Father    Cancer Sister    Cancer Maternal Uncle    Cancer Paternal Aunt    Cancer Maternal Grandmother    Cancer Maternal Grandfather  Current Facility-Administered Medications:    0.9 %  sodium chloride infusion, 250 mL, Intravenous, PRN, Posey Pronto, Ekta V, MD   0.9 %  sodium chloride infusion, , Intravenous, Continuous, Para Skeans, MD, Last Rate: 75 mL/hr at 07/06/21 1445, New Bag at 07/06/21 1445   doxepin (SINEQUAN) capsule 50 mg, 50 mg, Oral, QHS, Patel, Ekta V, MD   fluticasone (FLONASE) 50 MCG/ACT nasal spray 1 spray, 1 spray, Each Nare, Daily PRN, Para Skeans, MD   hydroxychloroquine (PLAQUENIL) tablet 200 mg, 200 mg, Oral,  BID, Florina Ou V, MD, 200 mg at 07/06/21 0134   hydrOXYzine (ATARAX) tablet 10 mg, 10 mg, Oral, TID PRN, Para Skeans, MD   nicotine (NICODERM CQ - dosed in mg/24 hours) patch 21 mg, 21 mg, Transdermal, Daily, Posey Pronto, Ekta V, MD   pantoprazole (PROTONIX) injection 40 mg, 40 mg, Intravenous, Q12H, Florina Ou V, MD, 40 mg at 07/06/21 1112   predniSONE (DELTASONE) tablet 10 mg, 10 mg, Oral, Q breakfast, Dallie Piles, RPH   sodium chloride flush (NS) 0.9 % injection 3 mL, 3 mL, Intravenous, Q12H, Florina Ou V, MD, 3 mL at 07/05/21 2218   sodium chloride flush (NS) 0.9 % injection 3 mL, 3 mL, Intravenous, PRN, Para Skeans, MD   Physical exam:  Vitals:   07/06/21 0037 07/06/21 0310 07/06/21 0712 07/06/21 1522  BP: 117/73 112/62 111/64 114/67  Pulse: 81 77 76 81  Resp: 17 18 18 18   Temp: (!) 97.3 F (36.3 C) 97.7 F (36.5 C) 97.9 F (36.6 C) 98.1 F (36.7 C)  TempSrc:  Oral Oral Oral  SpO2: 96% 97% 96% 95%  Weight:      Height:       Physical Exam Constitutional:      General: He is not in acute distress.    Appearance: He is not diaphoretic.  HENT:     Head: Normocephalic and atraumatic.     Nose: Nose normal.     Mouth/Throat:     Pharynx: No oropharyngeal exudate.  Eyes:     General: No scleral icterus.    Pupils: Pupils are equal, round, and reactive to light.  Cardiovascular:     Rate and Rhythm: Normal rate and regular rhythm.     Heart sounds: No murmur heard. Pulmonary:     Effort: Pulmonary effort is normal. No respiratory distress.     Breath sounds: No rales.     Comments: + nasal cannula oxygen.  Chest:     Chest wall: No tenderness.  Abdominal:     General: There is no distension.     Palpations: Abdomen is soft.     Tenderness: There is no abdominal tenderness.  Musculoskeletal:        General: Normal range of motion.     Cervical back: Normal range of motion and neck supple.  Skin:    General: Skin is warm and dry.     Findings: No erythema.      Comments: Scaly erythematous rash noted over face, base of the neck bilateral forearms and hands   Neurological:     Mental Status: He is alert. Mental status is at baseline.     Motor: No abnormal muscle tone.     Comments: Deaf  Psychiatric:        Mood and Affect: Affect normal.        CMP Latest Ref Rng & Units 07/06/2021  Glucose 70 - 99 mg/dL 80  BUN 8 -  23 mg/dL 14  Creatinine 0.61 - 1.24 mg/dL 0.60(L)  Sodium 135 - 145 mmol/L 131(L)  Potassium 3.5 - 5.1 mmol/L 3.9  Chloride 98 - 111 mmol/L 98  CO2 22 - 32 mmol/L 26  Calcium 8.9 - 10.3 mg/dL 8.9  Total Protein 6.5 - 8.1 g/dL 6.6  Total Bilirubin 0.3 - 1.2 mg/dL 0.7  Alkaline Phos 38 - 126 U/L 69  AST 15 - 41 U/L 80(H)  ALT 0 - 44 U/L 55(H)   CBC Latest Ref Rng & Units 07/06/2021  WBC 4.0 - 10.5 K/uL 8.9  Hemoglobin 13.0 - 17.0 g/dL 13.1  Hematocrit 39.0 - 52.0 % 39.1  Platelets 150 - 400 K/uL 167    RADIOGRAPHIC STUDIES: I have personally reviewed the radiological images as listed and agreed with the findings in the report. CT CHEST ABDOMEN PELVIS W CONTRAST  Result Date: 07/05/2021 CLINICAL DATA:  [name] presents for follow-up of suspected pulmonary neoplasm. EXAM: CT CHEST, ABDOMEN, AND PELVIS WITH CONTRAST TECHNIQUE: Multidetector CT imaging of the chest, abdomen and pelvis was performed following the standard protocol during bolus administration of intravenous contrast. CONTRAST:  156mL OMNIPAQUE IOHEXOL 300 MG/ML  SOLN COMPARISON:  Most recent comparison imaging of the chest are more remote chest x-rays. No direct comparison imaging is available with CT. Report from [facility] references a 1.6 x 1.00 cm pulmonary nodule in the LEFT upper lobe. FINDINGS: CT CHEST FINDINGS Cardiovascular: Calcified and noncalcified atheromatous plaque of the thoracic aorta. Normal heart size. Three-vessel coronary artery disease. Mild engorgement of central pulmonary vasculature. Mediastinum/Nodes:  Diffuse adenopathy throughout the mediastinum with heterogeneous and necrotic appearance. LEFT paratracheal mass 3.8 x 3.3 cm (image 26/2) AP window lymph node 2.3 cm short axis. RIGHT paratracheal/peribronchial lymph node (image 30/2) 16 mm short axis. Cystic lesion in the anterior mediastinum reported as benign on previous imaging, no comparison available. Pre-vascular lymph node adjacent to a LEFT upper lobe mass (image 20/2) pre-vascular lymph node at 13 mm. No thoracic inlet adenopathy. Is no axillary adenopathy. Lungs/Pleura: Numerous pulmonary nodules, too numerous to count. Largest in the LEFT upper lobe invading mediastinal fat measuring 3.6 x 3.0 cm, reportedly 1.6 x 1.0 cm on previous imaging assuming this is the same nodule. Central mass, more likely LEFT juxta hilar suprahilar adenopathy 3.0 x 3.4 cm (image 69/3) LEFT upper lobe pulmonary nodule 11 mm (image 73/3) Numerous additional pulmonary nodules on the LEFT and RIGHT. Largest in the LEFT lower lobe 2.6 x 2.3 cm. Largest in the RIGHT lower lobe (image 132/3) 2.1 cm. Airways are patent aside from LEFT upper lobe bronchials which are narrowed, narrowing of central bronchial structures. No sign of pleural effusion. No substantial septal thickening but some subtle nodularity along the fissure in the LEFT chest is noted. Musculoskeletal: Destructive and expansile rib lesion (image 50/3) 2.5 x 2.1 cm involving the LEFT posterior fifth rib. See below for full musculoskeletal details. CT ABDOMEN PELVIS FINDINGS Hepatobiliary: Surface nodularity along the entire surface of the RIGHT hemi liver peripherally and in more since pouch subcentimeter nodules throughout this location example seen on image 74 of series 2. No parenchymal lesion with suspicious features, small cysts suspected in the LEFT hepatic lobe. Lobular hepatic contours with fissural widening. Portal vein is patent. Pancreas: Normal, without mass, inflammation or ductal dilatation. Mass adjacent  to the pancreas likely within the lesser sac. Extensive serosal and peritoneal nodularity throughout the abdomen, see below. No pancreatic ductal dilation or sign of inflammation.  Spleen: Spleen normal size and contour. Adrenals/Urinary Tract: Signs of adrenal metastases bilaterally. LEFT adrenal mass (image 61/2) 3.8 x 3.6 cm. RIGHT adrenal mass (image 60/2) 2.5 cm short axis. Infiltrative parenchymal involvement of the RIGHT and LEFT kidney, on the RIGHT 6.8 x 5.0 cm and on the LEFT 6.1 cm greatest axial dimension. Surface nodularity along the renal contour, on the RIGHT, for example 3.1 cm nodule along the upper pole the RIGHT kidney. Additional areas of heterogeneity in the kidneys smaller and more subtle. No hydronephrosis. Signs of nephrolithiasis with branched calculi, largest in the lower pole on the LEFT 2.7 x 1.8 cm. This extends into the infundibulum and peripheral aspect of the renal sinus. Two smaller calculi in the lower and interpolar LEFT kidney measuring up to 1.6 cm. Smaller calculi seen on the RIGHT largest 9 mm. Stomach/Bowel: Serosal involvement of the gastric antrum (image 69/2) 2.3 cm. Smaller areas of serosal nodularity along the stomach in this location tracking towards the pancreas. No signs of bowel obstruction or acute bowel process in the setting of extensive peritoneal disease Vascular/Lymphatic: Severe vascular disease. 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 Contrast seen distal to this level in the iliac vasculature. Heavily calcified iliac vessels and plicae that this is a chronic process. Bulky retroperitoneal adenopathy (image 69/2) retrocaval lymph node 2.1 cm. LEFT para-aortic adenopathy for example (image 71/2) 1.6 cm. Intra-aortocaval adenopathy (image 85/2) 1.3 cm. Scattered smaller lymph nodes throughout the retroperitoneum. No pelvic adenopathy. Reproductive: Unremarkable by CT. Other: Extensive peritoneal disease and omental  nodularity. No ascites. For example anterior to the transverse colon on image 86/2 is a 2.9 cm peritoneal nodule. Surface nodularity in more since pouch along the under surface of the RIGHT hemi liver as described. Omental nodule in the LEFT upper quadrant (image 75/2) 3.2 cm. Smaller nodules too numerous to count throughout the anterior abdomen elsewhere. Musculoskeletal: Destructive rib lesion in the LEFT chest as described. Lucent area in the LEFT inferior pubic ramus with ill-defined enhancement along the margin of the pubic bone at this level (image 121/2) 3.6 x 1.1 cm. Destructive LEFT acetabular lesion (image 109/2) 2.0 x 1.6 cm. Spinal degenerative changes. IMPRESSION: Metastatic disease throughout the chest, abdomen and pelvis involving lungs, lymph nodes, viscera and bone as discussed. Distribution and appearance would favor pulmonary primary with metastatic disease. Bilateral adrenal and renal involvement. Infiltrative process in the bilateral kidneys could certainly be seen in the setting of pulmonary metastasis. Biopsy may be helpful given the diffuse nature of disease and the presence of bilateral infiltrative renal lesions. Comparison with prior imaging could also be helpful. Bilateral nephrolithiasis. Signs of extensive vascular disease with chronic appearing aorto iliac occlusive disease Three-vessel coronary artery disease. Aortic atherosclerosis. Aortic Atherosclerosis (ICD10-I70.0) and Emphysema (ICD10-J43.9). Electronically Signed   By: Zetta Bills M.D.   On: 07/05/2021 15:41   DG Chest Portable 1 View  Result Date: 07/05/2021 CLINICAL DATA:  Hemoptysis EXAM: PORTABLE CHEST 1 VIEW COMPARISON:  CT from earlier in the same day. FINDINGS: Cardiac shadow is within normal limits. Soft tissue density is noted in the medial aspect of the left apex consistent with the mass lesion seen on recent CT examination. Fullness in the left hilum is again noted. Scattered pulmonary nodules are noted  similar to that seen on prior CT. No acute bony abnormality is noted. Postsurgical changes in the cervical spine are noted. IMPRESSION: Changes similar to that seen on recent CT examination. No acute abnormality noted.  Electronically Signed   By: Inez Catalina M.D.   On: 07/05/2021 21:24    Assessment and plan-   # metastatic malignancy clinically.  CT images were independently reviewed by me and discussed with patient.   Unfortunately patient has metastatic disease throughout the chest, abdomen and pelvis including lungs, lymphadenopathy, omental nodularity, pericardial disease, adrenal and renal involvement, bony lesions. I recommend biopsy to confirm tissue diagnosis. Recommend IR CT-guided biopsy, peritoneal disease/omental nodularity likely feasible for biopsy. Further management plan pending tissue diagnosis.  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     Thank you for allowing me to participate in the care of this patient.   Earlie Server, MD, PhD 07/06/2021

## 2021-07-06 NOTE — Telephone Encounter (Signed)
Called the number and go the voicemail and left message that Dr. Janese Banks on vacation and Dr. Grayland Ormond is covering and should be seein gpt in hospital to make a plan for pt

## 2021-07-07 MED ORDER — PHENOL 1.4 % MT LIQD
1.0000 | OROMUCOSAL | Status: DC | PRN
  Administered 2021-07-07: 1 via OROMUCOSAL
  Filled 2021-07-07 (×2): qty 177

## 2021-07-07 NOTE — Progress Notes (Signed)
PROGRESS NOTE    Frederick Mitchell  HBZ:[id number] DOB: 1956-04-18 DOA: 07/05/2021 PCP: Gayland Curry, MD    Brief Narrative:  Patient with diagnosis of lung cancer presents with 8 episodes of coughing up blood with blood-streaked sputum x8 yesterday. Takes NSAIDs prn. None last week, only took tylenol.   12/10had some bloody cough this am.    Consultants:   oncology  Procedures:   Antimicrobials:      Subjective: No sob, no cp, no dizziness  Objective: Vitals:   07/06/21 1522 07/06/21 1930 07/07/21 0400 07/07/21 0751  BP: 114/67 112/65 110/62 (!) 111/59  Pulse: 81 80 75 80  Resp: 18 18 20 18   Temp: 98.1 F (36.7 C) 98 F (36.7 C) 97.9 F (36.6 C) 98 F (36.7 C)  TempSrc: Oral   Oral  SpO2: 95% 95% 95% 95%  Weight:      Height:        Intake/Output Summary (Last 24 hours) at 07/07/2021 0851 Last data filed at 07/07/2021 1017 Gross per 24 hour  Intake 2140.26 ml  Output 1175 ml  Net 965.26 ml   Filed Weights   07/05/21 1631  Weight: 73.5 kg    Examination: Nad, calm CTA no wheeze Regular S1-S2 no gallops Soft benign positive bowel sounds No edema Awake and alert    Data Reviewed: I have personally reviewed following labs and imaging studies  CBC: Recent Labs  Lab 07/05/21 1633 07/06/21 0214 07/06/21 2055  WBC 9.6 8.9  --   HGB 13.8 13.1 13.3  HCT 40.9 39.1 39.5  MCV 92.5 93.1  --   PLT 178 167  --    Basic Metabolic Panel: Recent Labs  Lab 07/05/21 1633 07/06/21 1421  NA 131* 131*  K 3.8 3.9  CL 96* 98  CO2 17* 26  GLUCOSE 69* 80  BUN 16 14  CREATININE 0.52* 0.60*  CALCIUM 9.6 8.9   GFR: Estimated Creatinine Clearance: 83.8 mL/min (A) (by C-G formula based on SCr of 0.6 mg/dL (L)). Liver Function Tests: Recent Labs  Lab 07/05/21 1633 07/06/21 1421  AST 93* 80*  ALT 64* 55*  ALKPHOS 73 69  BILITOT 0.6 0.7  PROT 7.1 6.6  ALBUMIN 3.4* 2.9*   Recent Labs  Lab 07/05/21 1633  LIPASE 45   No results for input(s):  AMMONIA in the last 168 hours. Coagulation Profile: Recent Labs  Lab 07/05/21 2143  INR 0.9   Cardiac Enzymes: No results for input(s): CKTOTAL, CKMB, CKMBINDEX, TROPONINI in the last 168 hours. BNP (last 3 results) No results for input(s): PROBNP in the last 8760 hours. HbA1C: No results for input(s): HGBA1C in the last 72 hours. CBG: No results for input(s): GLUCAP in the last 168 hours. Lipid Profile: No results for input(s): CHOL, HDL, LDLCALC, TRIG, CHOLHDL, LDLDIRECT in the last 72 hours. Thyroid Function Tests: No results for input(s): TSH, T4TOTAL, FREET4, T3FREE, THYROIDAB in the last 72 hours. Anemia Panel: No results for input(s): VITAMINB12, FOLATE, FERRITIN, TIBC, IRON, RETICCTPCT in the last 72 hours. Sepsis Labs: No results for input(s): PROCALCITON, LATICACIDVEN in the last 168 hours.  Recent Results (from the past 240 hour(s))  Resp Panel by RT-PCR (Flu A&B, Covid) Nasopharyngeal Swab     Status: None   Collection Time: 07/05/21  9:09 PM   Specimen: Nasopharyngeal Swab; Nasopharyngeal(NP) swabs in vial transport medium  Result Value Ref Range Status   SARS Coronavirus 2 by RT PCR NEGATIVE NEGATIVE Final    Comment: (NOTE) SARS-CoV-2  target nucleic acids are NOT DETECTED.  The SARS-CoV-2 RNA is generally detectable in upper respiratory specimens during the acute phase of infection. The lowest concentration of SARS-CoV-2 viral copies this assay can detect is 138 copies/mL. A negative result does not preclude SARS-Cov-2 infection and should not be used as the sole basis for treatment or other patient management decisions. A negative result may occur with  improper specimen collection/handling, submission of specimen other than nasopharyngeal swab, presence of viral mutation(s) within the areas targeted by this assay, and inadequate number of viral copies(<138 copies/mL). A negative result must be combined with clinical observations, patient history, and  epidemiological information. The expected result is Negative.  Fact Sheet for Patients:  EntrepreneurPulse.com.au  Fact Sheet for Healthcare Providers:  IncredibleEmployment.be  This test is no t yet approved or cleared by the [place] FDA and  has been authorized for detection and/or diagnosis of SARS-CoV-2 by FDA under an Emergency Use Authorization (EUA). This EUA will remain  in effect (meaning this test can be used) for the duration of the COVID-19 declaration under Section 564(b)(1) of the Act, 21 U.S.C.section 360bbb-3(b)(1), unless the authorization is terminated  or revoked sooner.       Influenza A by PCR NEGATIVE NEGATIVE Final   Influenza B by PCR NEGATIVE NEGATIVE Final    Comment: (NOTE) The Xpert Xpress SARS-CoV-2/FLU/RSV plus assay is intended as an aid in the diagnosis of influenza from Nasopharyngeal swab specimens and should not be used as a sole basis for treatment. Nasal washings and aspirates are unacceptable for Xpert Xpress SARS-CoV-2/FLU/RSV testing.  Fact Sheet for Patients: EntrepreneurPulse.com.au  Fact Sheet for Healthcare Providers: IncredibleEmployment.be  This test is not yet approved or cleared by the [place] FDA and has been authorized for detection and/or diagnosis of SARS-CoV-2 by FDA under an Emergency Use Authorization (EUA). This EUA will remain in effect (meaning this test can be used) for the duration of the COVID-19 declaration under Section 564(b)(1) of the Act, 21 U.S.C. section 360bbb-3(b)(1), unless the authorization is terminated or revoked.  Performed at [facility], [address] [place], [place] [address]          Radiology Studies: CT CHEST ABDOMEN PELVIS W CONTRAST  Result Date: 07/05/2021 CLINICAL DATA:  [name] presents for follow-up of suspected pulmonary neoplasm. EXAM: CT CHEST, ABDOMEN, AND PELVIS WITH  CONTRAST TECHNIQUE: Multidetector CT imaging of the chest, abdomen and pelvis was performed following the standard protocol during bolus administration of intravenous contrast. CONTRAST:  136mL OMNIPAQUE IOHEXOL 300 MG/ML  SOLN COMPARISON:  Most recent comparison imaging of the chest are more remote chest x-rays. No direct comparison imaging is available with CT. Report from [place] center references a 1.6 x 1.00 cm pulmonary nodule in the LEFT upper lobe. FINDINGS: CT CHEST FINDINGS Cardiovascular: Calcified and noncalcified atheromatous plaque of the thoracic aorta. Normal heart size. Three-vessel coronary artery disease. Mild engorgement of central pulmonary vasculature. Mediastinum/Nodes: Diffuse adenopathy throughout the mediastinum with heterogeneous and necrotic appearance. LEFT paratracheal mass 3.8 x 3.3 cm (image 26/2) AP window lymph node 2.3 cm short axis. RIGHT paratracheal/peribronchial lymph node (image 30/2) 16 mm short axis. Cystic lesion in the anterior mediastinum reported as benign on previous imaging, no comparison available. Pre-vascular lymph node adjacent to a LEFT upper lobe mass (image 20/2) pre-vascular lymph node at 13 mm. No thoracic inlet adenopathy. Is no axillary adenopathy. Lungs/Pleura: Numerous pulmonary nodules, too numerous to count. Largest in the LEFT upper  lobe invading mediastinal fat measuring 3.6 x 3.0 cm, reportedly 1.6 x 1.0 cm on previous imaging assuming this is the same nodule. Central mass, more likely LEFT juxta hilar suprahilar adenopathy 3.0 x 3.4 cm (image 69/3) LEFT upper lobe pulmonary nodule 11 mm (image 73/3) Numerous additional pulmonary nodules on the LEFT and RIGHT. Largest in the LEFT lower lobe 2.6 x 2.3 cm. Largest in the RIGHT lower lobe (image 132/3) 2.1 cm. Airways are patent aside from LEFT upper lobe bronchials which are narrowed, narrowing of central bronchial structures. No sign of pleural effusion. No substantial septal thickening  but some subtle nodularity along the fissure in the LEFT chest is noted. Musculoskeletal: Destructive and expansile rib lesion (image 50/3) 2.5 x 2.1 cm involving the LEFT posterior fifth rib. See below for full musculoskeletal details. CT ABDOMEN PELVIS FINDINGS Hepatobiliary: Surface nodularity along the entire surface of the RIGHT hemi liver peripherally and in more since pouch subcentimeter nodules throughout this location example seen on image 74 of series 2. No parenchymal lesion with suspicious features, small cysts suspected in the LEFT hepatic lobe. Lobular hepatic contours with fissural widening. Portal vein is patent. Pancreas: Normal, without mass, inflammation or ductal dilatation. Mass adjacent to the pancreas likely within the lesser sac. Extensive serosal and peritoneal nodularity throughout the abdomen, see below. No pancreatic ductal dilation or sign of inflammation. Spleen: Spleen normal size and contour. Adrenals/Urinary Tract: Signs of adrenal metastases bilaterally. LEFT adrenal mass (image 61/2) 3.8 x 3.6 cm. RIGHT adrenal mass (image 60/2) 2.5 cm short axis. Infiltrative parenchymal involvement of the RIGHT and LEFT kidney, on the RIGHT 6.8 x 5.0 cm and on the LEFT 6.1 cm greatest axial dimension. Surface nodularity along the renal contour, on the RIGHT, for example 3.1 cm nodule along the upper pole the RIGHT kidney. Additional areas of heterogeneity in the kidneys smaller and more subtle. No hydronephrosis. Signs of nephrolithiasis with branched calculi, largest in the lower pole on the LEFT 2.7 x 1.8 cm. This extends into the infundibulum and peripheral aspect of the renal sinus. Two smaller calculi in the lower and interpolar LEFT kidney measuring up to 1.6 cm. Smaller calculi seen on the RIGHT largest 9 mm. Stomach/Bowel: Serosal involvement of the gastric antrum (image 69/2) 2.3 cm. Smaller areas of serosal nodularity along the stomach in this location tracking towards the pancreas. No  signs of bowel obstruction or acute bowel process in the setting of extensive peritoneal disease Vascular/Lymphatic: Severe vascular disease. 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 Contrast seen distal to this level in the iliac vasculature. Heavily calcified iliac vessels and plicae that this is a chronic process. Bulky retroperitoneal adenopathy (image 69/2) retrocaval lymph node 2.1 cm. LEFT para-aortic adenopathy for example (image 71/2) 1.6 cm. Intra-aortocaval adenopathy (image 85/2) 1.3 cm. Scattered smaller lymph nodes throughout the retroperitoneum. No pelvic adenopathy. Reproductive: Unremarkable by CT. Other: Extensive peritoneal disease and omental nodularity. No ascites. For example anterior to the transverse colon on image 86/2 is a 2.9 cm peritoneal nodule. Surface nodularity in more since pouch along the under surface of the RIGHT hemi liver as described. Omental nodule in the LEFT upper quadrant (image 75/2) 3.2 cm. Smaller nodules too numerous to count throughout the anterior abdomen elsewhere. Musculoskeletal: Destructive rib lesion in the LEFT chest as described. Lucent area in the LEFT inferior pubic ramus with ill-defined enhancement along the margin of the pubic bone at this level (image 121/2) 3.6 x 1.1 cm. Destructive  LEFT acetabular lesion (image 109/2) 2.0 x 1.6 cm. Spinal degenerative changes. IMPRESSION: Metastatic disease throughout the chest, abdomen and pelvis involving lungs, lymph nodes, viscera and bone as discussed. Distribution and appearance would favor pulmonary primary with metastatic disease. Bilateral adrenal and renal involvement. Infiltrative process in the bilateral kidneys could certainly be seen in the setting of pulmonary metastasis. Biopsy may be helpful given the diffuse nature of disease and the presence of bilateral infiltrative renal lesions. Comparison with prior imaging could also be helpful. Bilateral nephrolithiasis.  Signs of extensive vascular disease with chronic appearing aorto iliac occlusive disease Three-vessel coronary artery disease. Aortic atherosclerosis. Aortic Atherosclerosis (ICD10-I70.0) and Emphysema (ICD10-J43.9). Electronically Signed   By: Zetta Bills M.D.   On: 07/05/2021 15:41   DG Chest Portable 1 View  Result Date: 07/05/2021 CLINICAL DATA:  Hemoptysis EXAM: PORTABLE CHEST 1 VIEW COMPARISON:  CT from earlier in the same day. FINDINGS: Cardiac shadow is within normal limits. Soft tissue density is noted in the medial aspect of the left apex consistent with the mass lesion seen on recent CT examination. Fullness in the left hilum is again noted. Scattered pulmonary nodules are noted similar to that seen on prior CT. No acute bony abnormality is noted. Postsurgical changes in the cervical spine are noted. IMPRESSION: Changes similar to that seen on recent CT examination. No acute abnormality noted. Electronically Signed   By: Inez Catalina M.D.   On: 07/05/2021 21:24        Scheduled Meds:  doxepin  50 mg Oral QHS   hydroxychloroquine  200 mg Oral BID   nicotine  21 mg Transdermal Daily   pantoprazole (PROTONIX) IV  40 mg Intravenous Q12H   predniSONE  10 mg Oral Q breakfast   sodium chloride flush  3 mL Intravenous Q12H   Continuous Infusions:  sodium chloride      Assessment & Plan:   Principal Problem:   Hemoptysis Active Problems:   COPD (chronic obstructive pulmonary disease) (HCC)   Coronary artery disease   Abnormal CT of the chest   Hematuria   Nephrolithiasis   Abnormal LFTs   Abnormal CT scan   1.Hemoptysis Likely due to l cancer Still having some today H&H has been stable will monitor Continue PPI    2.  Metastatic malignancy Patient with extensive smoking history Oncology consulted input was appreciated.  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 Elevated LFT Etiology unclear. Possibly due to statin and ?mets. Holding statin Repeat labs   6.hematuria None reported. H/h stable UA neg for infection .  Needs to see urology as outpt Avoid nsaids   7.hyponatremia Mild Possibly due to lung ca and hypovolemia On ivf Ck labs    DVT prophylaxis: scd Code Status: Full Family Communication: family updated. Disposition Plan:  Status is: Inpatient  Remains inpatient appropriate because: Due to severity of illness requiring hospitalization.  Oncology consulted pending.  Also receiving IV treatment            LOS: 2 days   Time spent: 35 min with >50% on coc    Nolberto Hanlon, MD [facility] Pager [phone]  If 7PM-7AM, please contact night-coverage 07/07/2021, 8:51 AM

## 2021-07-07 NOTE — Consult Note (Signed)
PULMONOLOGY         Date: 07/07/2021,   MRN# [id number] Frederick Mitchell [birth year]     AdmissionWeight: 73.5 kg                 CurrentWeight: 73.5 kg   Referring physician: Dr Kurtis Bushman   CHIEF COMPLAINT:   Non-Massive hemoptysis   HISTORY OF PRESENT ILLNESS   This is a [name] with hx of CAD, COPD, congenital left hip dysplasia, nephrolithiasis, hearing impariment, Lifelong smoking, metabolic syndrome.  He had CT chest with left lung mass noted and hemoptysis. He was seen by oncology and via help from sign language interpreter was able to report constitutional symptoms including weight loss unintentionally, severe fatigue, and has exensive history of cancer in immediate family. PCCM consultation for further evaluation and management with lung mass and hemoptysis. CT abd was reviewed by me with multiple metastatic lung lesions noted bilaterally in lungs with dominant left upper primary.There is also distant mets per report.    PAST MEDICAL HISTORY   Past Medical History:  Diagnosis Date   Deaf    Hypercholesteremia unk     SURGICAL HISTORY   Past Surgical History:  Procedure Laterality Date   EYE SURGERY       FAMILY HISTORY   Family History  Problem Relation Age of Onset   Cancer Father    Cancer Sister    Cancer Maternal Uncle    Cancer Paternal Aunt    Cancer Maternal Grandmother    Cancer Maternal Grandfather      SOCIAL HISTORY   Social History   Tobacco Use   Smoking status: Every Day    Types: Cigarettes   Smokeless tobacco: Never  Substance Use Topics   Alcohol use: No   Drug use: Not Currently     MEDICATIONS    Home Medication:    Current Medication:  Current Facility-Administered Medications:    0.9 %  sodium chloride infusion, 250 mL, Intravenous, PRN, Para Skeans, MD   doxepin (SINEQUAN) capsule 50 mg, 50 mg, Oral, QHS, Florina Ou V, MD, 50 mg at 07/06/21 2158   fluticasone (FLONASE) 50 MCG/ACT nasal spray 1 spray, 1  spray, Each Nare, Daily PRN, Para Skeans, MD   hydroxychloroquine (PLAQUENIL) tablet 200 mg, 200 mg, Oral, BID, Florina Ou V, MD, 200 mg at 07/07/21 0820   hydrOXYzine (ATARAX) tablet 10 mg, 10 mg, Oral, TID PRN, Para Skeans, MD   nicotine (NICODERM CQ - dosed in mg/24 hours) patch 21 mg, 21 mg, Transdermal, Daily, Posey Pronto, Ekta V, MD   pantoprazole (PROTONIX) injection 40 mg, 40 mg, Intravenous, Q12H, Florina Ou V, MD, 40 mg at 07/07/21 0820   phenol (CHLORASEPTIC) mouth spray 1 spray, 1 spray, Mouth/Throat, PRN, Nolberto Hanlon, MD, 1 spray at 07/07/21 1148   predniSONE (DELTASONE) tablet 10 mg, 10 mg, Oral, Q breakfast, Dallie Piles, RPH, 10 mg at 07/07/21 0820   sodium chloride flush (NS) 0.9 % injection 3 mL, 3 mL, Intravenous, Q12H, Florina Ou V, MD, 3 mL at 07/07/21 2542   sodium chloride flush (NS) 0.9 % injection 3 mL, 3 mL, Intravenous, PRN, Para Skeans, MD    ALLERGIES   Patient has no known allergies.     REVIEW OF SYSTEMS    Review of Systems:  Gen:  Denies  fever, sweats, chills weigh loss  HEENT: Denies blurred vision, double vision, ear pain, eye pain, hearing loss, nose bleeds, sore  throat Cardiac:  No dizziness, chest pain or heaviness, chest tightness,edema Resp:   Denies cough or sputum porduction, shortness of breath,wheezing, hemoptysis,  Gi: Denies swallowing difficulty, stomach pain, nausea or vomiting, diarrhea, constipation, bowel incontinence Gu:  Denies bladder incontinence, burning urine Ext:   Denies Joint pain, stiffness or swelling Skin: Denies  skin rash, easy bruising or bleeding or hives Endoc:  Denies polyuria, polydipsia , polyphagia or weight change Psych:   Denies depression, insomnia or hallucinations   Other:  All other systems negative   VS: BP 121/61 (BP Location: Left Arm)   Pulse 83   Temp 98 F (36.7 C) (Oral)   Resp 18   Ht 5\' 7"  (1.702 m)   Wt 73.5 kg   SpO2 97%   BMI 25.37 kg/m      PHYSICAL EXAM     GENERAL:NAD, no fevers, chills, no weakness no fatigue HEAD: Normocephalic, atraumatic.  EYES: Pupils equal, round, reactive to light. Extraocular muscles intact. No scleral icterus.  MOUTH: Moist mucosal membrane. Dentition intact. No abscess noted.  EAR, NOSE, THROAT: Clear without exudates. No external lesions.  NECK: Supple. No thyromegaly. No nodules. No JVD.  PULMONARY: Diffuse coarse rhonchi right sided +wheezes CARDIOVASCULAR: S1 and S2. Regular rate and rhythm. No murmurs, rubs, or gallops. No edema. Pedal pulses 2+ bilaterally.  GASTROINTESTINAL: Soft, nontender, nondistended. No masses. Positive bowel sounds. No hepatosplenomegaly.  MUSCULOSKELETAL: No swelling, clubbing, or edema. Range of motion full in all extremities.  NEUROLOGIC: Cranial nerves II through XII are intact. No gross focal neurological deficits. Sensation intact. Reflexes intact.  SKIN: No ulceration, lesions, rashes, or cyanosis. Skin warm and dry. Turgor intact.  PSYCHIATRIC: Mood, affect within normal limits. The patient is awake, alert and oriented x 3. Insight, judgment intact.       IMAGING    CT CHEST ABDOMEN PELVIS W CONTRAST  Result Date: 07/05/2021 CLINICAL DATA:  [name] presents for follow-up of suspected pulmonary neoplasm. EXAM: CT CHEST, ABDOMEN, AND PELVIS WITH CONTRAST TECHNIQUE: Multidetector CT imaging of the chest, abdomen and pelvis was performed following the standard protocol during bolus administration of intravenous contrast. CONTRAST:  129mL OMNIPAQUE IOHEXOL 300 MG/ML  SOLN COMPARISON:  Most recent comparison imaging of the chest are more remote chest x-rays. No direct comparison imaging is available with CT. Report from [facility] references a 1.6 x 1.00 cm pulmonary nodule in the LEFT upper lobe. FINDINGS: CT CHEST FINDINGS Cardiovascular: Calcified and noncalcified atheromatous plaque of the thoracic aorta. Normal heart size. Three-vessel coronary  artery disease. Mild engorgement of central pulmonary vasculature. Mediastinum/Nodes: Diffuse adenopathy throughout the mediastinum with heterogeneous and necrotic appearance. LEFT paratracheal mass 3.8 x 3.3 cm (image 26/2) AP window lymph node 2.3 cm short axis. RIGHT paratracheal/peribronchial lymph node (image 30/2) 16 mm short axis. Cystic lesion in the anterior mediastinum reported as benign on previous imaging, no comparison available. Pre-vascular lymph node adjacent to a LEFT upper lobe mass (image 20/2) pre-vascular lymph node at 13 mm. No thoracic inlet adenopathy. Is no axillary adenopathy. Lungs/Pleura: Numerous pulmonary nodules, too numerous to count. Largest in the LEFT upper lobe invading mediastinal fat measuring 3.6 x 3.0 cm, reportedly 1.6 x 1.0 cm on previous imaging assuming this is the same nodule. Central mass, more likely LEFT juxta hilar suprahilar adenopathy 3.0 x 3.4 cm (image 69/3) LEFT upper lobe pulmonary nodule 11 mm (image 73/3) Numerous additional pulmonary nodules on the LEFT and RIGHT. Largest in the LEFT lower lobe  2.6 x 2.3 cm. Largest in the RIGHT lower lobe (image 132/3) 2.1 cm. Airways are patent aside from LEFT upper lobe bronchials which are narrowed, narrowing of central bronchial structures. No sign of pleural effusion. No substantial septal thickening but some subtle nodularity along the fissure in the LEFT chest is noted. Musculoskeletal: Destructive and expansile rib lesion (image 50/3) 2.5 x 2.1 cm involving the LEFT posterior fifth rib. See below for full musculoskeletal details. CT ABDOMEN PELVIS FINDINGS Hepatobiliary: Surface nodularity along the entire surface of the RIGHT hemi liver peripherally and in more since pouch subcentimeter nodules throughout this location example seen on image 74 of series 2. No parenchymal lesion with suspicious features, small cysts suspected in the LEFT hepatic lobe. Lobular hepatic contours with fissural widening. Portal vein is  patent. Pancreas: Normal, without mass, inflammation or ductal dilatation. Mass adjacent to the pancreas likely within the lesser sac. Extensive serosal and peritoneal nodularity throughout the abdomen, see below. No pancreatic ductal dilation or sign of inflammation. Spleen: Spleen normal size and contour. Adrenals/Urinary Tract: Signs of adrenal metastases bilaterally. LEFT adrenal mass (image 61/2) 3.8 x 3.6 cm. RIGHT adrenal mass (image 60/2) 2.5 cm short axis. Infiltrative parenchymal involvement of the RIGHT and LEFT kidney, on the RIGHT 6.8 x 5.0 cm and on the LEFT 6.1 cm greatest axial dimension. Surface nodularity along the renal contour, on the RIGHT, for example 3.1 cm nodule along the upper pole the RIGHT kidney. Additional areas of heterogeneity in the kidneys smaller and more subtle. No hydronephrosis. Signs of nephrolithiasis with branched calculi, largest in the lower pole on the LEFT 2.7 x 1.8 cm. This extends into the infundibulum and peripheral aspect of the renal sinus. Two smaller calculi in the lower and interpolar LEFT kidney measuring up to 1.6 cm. Smaller calculi seen on the RIGHT largest 9 mm. Stomach/Bowel: Serosal involvement of the gastric antrum (image 69/2) 2.3 cm. Smaller areas of serosal nodularity along the stomach in this location tracking towards the pancreas. No signs of bowel obstruction or acute bowel process in the setting of extensive peritoneal disease Vascular/Lymphatic: Severe vascular disease. 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 Contrast seen distal to this level in the iliac vasculature. Heavily calcified iliac vessels and plicae that this is a chronic process. Bulky retroperitoneal adenopathy (image 69/2) retrocaval lymph node 2.1 cm. LEFT para-aortic adenopathy for example (image 71/2) 1.6 cm. Intra-aortocaval adenopathy (image 85/2) 1.3 cm. Scattered smaller lymph nodes throughout the retroperitoneum. No pelvic adenopathy.  Reproductive: Unremarkable by CT. Other: Extensive peritoneal disease and omental nodularity. No ascites. For example anterior to the transverse colon on image 86/2 is a 2.9 cm peritoneal nodule. Surface nodularity in more since pouch along the under surface of the RIGHT hemi liver as described. Omental nodule in the LEFT upper quadrant (image 75/2) 3.2 cm. Smaller nodules too numerous to count throughout the anterior abdomen elsewhere. Musculoskeletal: Destructive rib lesion in the LEFT chest as described. Lucent area in the LEFT inferior pubic ramus with ill-defined enhancement along the margin of the pubic bone at this level (image 121/2) 3.6 x 1.1 cm. Destructive LEFT acetabular lesion (image 109/2) 2.0 x 1.6 cm. Spinal degenerative changes. IMPRESSION: Metastatic disease throughout the chest, abdomen and pelvis involving lungs, lymph nodes, viscera and bone as discussed. Distribution and appearance would favor pulmonary primary with metastatic disease. Bilateral adrenal and renal involvement. Infiltrative process in the bilateral kidneys could certainly be seen in the setting of pulmonary metastasis. Biopsy  may be helpful given the diffuse nature of disease and the presence of bilateral infiltrative renal lesions. Comparison with prior imaging could also be helpful. Bilateral nephrolithiasis. Signs of extensive vascular disease with chronic appearing aorto iliac occlusive disease Three-vessel coronary artery disease. Aortic atherosclerosis. Aortic Atherosclerosis (ICD10-I70.0) and Emphysema (ICD10-J43.9). Electronically Signed   By: Zetta Bills M.D.   On: 07/05/2021 15:41   DG Chest Portable 1 View  Result Date: 07/05/2021 CLINICAL DATA:  Hemoptysis EXAM: PORTABLE CHEST 1 VIEW COMPARISON:  CT from earlier in the same day. FINDINGS: Cardiac shadow is within normal limits. Soft tissue density is noted in the medial aspect of the left apex consistent with the mass lesion seen on recent CT examination.  Fullness in the left hilum is again noted. Scattered pulmonary nodules are noted similar to that seen on prior CT. No acute bony abnormality is noted. Postsurgical changes in the cervical spine are noted. IMPRESSION: Changes similar to that seen on recent CT examination. No acute abnormality noted. Electronically Signed   By: Inez Catalina M.D.   On: 07/05/2021 21:24      ASSESSMENT/PLAN   Left lung mass with distant metastatic lesions - agree with oncology evaluation - likely metastatic lung cancer - IR biopsy ordered -If IR unable to perform biopsy we will be able to do bronchoscopy  -I spoke to sister of patient Frederick Mitchell) and patient with wife.  He wants to go home but his wife and sister want him to stay for biopsy.  I will call palliative and he can discuss what he wants to do. I recommend at least getting an answer as to what this is prior.    Non-massive hemoptysis  -patient is on flonase which often causes epistaxis and we can dc this for now -currently with no ongoing hemptysis will continue to monitor for recurrence -no anticoagulation on MAR or antiplatelet agents  -will consider nebulized traxeamic acid if necessary    Advanced Centrilobular emphysema with COPD -Agree with prednisone and nebulizer therapy    Thank you for allowing me to participate in the care of this patient.  Total face to face encounter time for this patient visit was >46min. >50% of the time was  spent in counseling and coordination of care.   Patient/Family are satisfied with care plan and all questions have been answered.  This document was prepared using Dragon voice recognition software and may include unintentional dictation errors.     Ottie Glazier, M.D.  Division of [facility]

## 2021-07-07 NOTE — Plan of Care (Signed)
  Problem: Education: Goal: Knowledge of General Education information will improve Description: Including pain rating scale, medication(s)/side effects and non-pharmacologic comfort measures Outcome: Progressing   Problem: Health Behavior/Discharge Planning: Goal: Ability to manage health-related needs will improve Outcome: Progressing   Problem: Clinical Measurements: Goal: Ability to maintain clinical measurements within normal limits will improve Outcome: Progressing Goal: Diagnostic test results will improve Outcome: Progressing Goal: Respiratory complications will improve Outcome: Progressing   Problem: Activity: Goal: Risk for activity intolerance will decrease Outcome: Progressing   Problem: Nutrition: Goal: Adequate nutrition will be maintained Outcome: Progressing   Problem: Elimination: Goal: Will not experience complications related to bowel motility Outcome: Progressing   Problem: Pain Managment: Goal: General experience of comfort will improve Outcome: Progressing   Problem: Safety: Goal: Ability to remain free from injury will improve Outcome: Progressing   Problem: Skin Integrity: Goal: Risk for impaired skin integrity will decrease Outcome: Progressing

## 2021-07-08 NOTE — Plan of Care (Signed)
Problem: Education: Goal: Knowledge of General Education information will improve Description: Including pain rating scale, medication(s)/side effects and non-pharmacologic comfort measures Outcome: Progressing   Problem: Health Behavior/Discharge Planning: Goal: Ability to manage health-related needs will improve Outcome: Progressing   Problem: Clinical Measurements: Goal: Ability to maintain clinical measurements within normal limits will improve Outcome: Progressing Goal: Respiratory complications will improve Outcome: Progressing   Problem: Activity: Goal: Risk for activity intolerance will decrease Outcome: Progressing   Problem: Nutrition: Goal: Adequate nutrition will be maintained Outcome: Progressing   Problem: Coping: Goal: Level of anxiety will decrease Outcome: Progressing   Problem: Elimination: Goal: Will not experience complications related to bowel motility Outcome: Progressing Goal: Will not experience complications related to urinary retention Outcome: Progressing   Problem: Pain Managment: Goal: General experience of comfort will improve Outcome: Progressing   Problem: Safety: Goal: Ability to remain free from injury will improve Outcome: Progressing   Problem: Skin Integrity: Goal: Risk for impaired skin integrity will decrease Outcome: Progressing   

## 2021-07-08 NOTE — Progress Notes (Signed)
   07/08/21 1910  Clinical Encounter Type  Visited With Health care provider  Visit Type Initial  Referral From Nurse  Consult/Referral To Chaplain  Recommendations advanced directive  Frederick Mitchell attempted to offer education on advanced directive. Bot hPt and spouse are hearing impaired. Sought nurse on unit to assist; we were not able to locate the [place] interpreter on the unit at this time. Will refer for f/u.

## 2021-07-08 NOTE — Progress Notes (Signed)
PROGRESS NOTE    Frederick Mitchell  IRC:[id number] DOB: Mar 09, 1956 DOA: 07/05/2021 PCP: Gayland Curry, MD    Brief Narrative:  Patient with diagnosis of lung cancer presents with 8 episodes of coughing up blood with blood-streaked sputum x8 yesterday. Takes NSAIDs prn. None last week, only took tylenol.   12/11 no further hemoptysis   Consultants:   oncology  Procedures:   Antimicrobials:      Subjective: No shortness of breath, chest pain, or any other symptoms Has taken off compression boots.  Told patient this is for DVT prophylaxis since we cannot put him on anticoagulation.  He verbalizes an understanding.  Sister interpreted for him .   Objective: Vitals:   07/07/21 1617 07/07/21 1954 07/08/21 0442 07/08/21 0733  BP: 121/61 (!) 142/67 115/64 113/63  Pulse: 83 84 81 82  Resp: 18 16 16 18   Temp: 98 F (36.7 C) 98.2 F (36.8 C) 97.9 F (36.6 C) 97.6 F (36.4 C)  TempSrc: Oral Oral Oral Oral  SpO2: 97% 98% 95% 91%  Weight:      Height:        Intake/Output Summary (Last 24 hours) at 07/08/2021 0900 Last data filed at 07/08/2021 5102 Gross per 24 hour  Intake 1123 ml  Output 1 ml  Net 1122 ml   Filed Weights   07/05/21 1631  Weight: 73.5 kg    Examination: NAD Calm CTA no wheeze Regular S1-S2 no gallop Soft benign positive bowel sounds No edema Grossly intact     Data Reviewed: I have personally reviewed following labs and imaging studies  CBC: Recent Labs  Lab 07/05/21 1633 07/06/21 0214 07/06/21 2055  WBC 9.6 8.9  --   HGB 13.8 13.1 13.3  HCT 40.9 39.1 39.5  MCV 92.5 93.1  --   PLT 178 167  --    Basic Metabolic Panel: Recent Labs  Lab 07/05/21 1633 07/06/21 1421  NA 131* 131*  K 3.8 3.9  CL 96* 98  CO2 17* 26  GLUCOSE 69* 80  BUN 16 14  CREATININE 0.52* 0.60*  CALCIUM 9.6 8.9   GFR: Estimated Creatinine Clearance: 83.8 mL/min (A) (by C-G formula based on SCr of 0.6 mg/dL (L)). Liver Function Tests: Recent Labs  Lab  07/05/21 1633 07/06/21 1421  AST 93* 80*  ALT 64* 55*  ALKPHOS 73 69  BILITOT 0.6 0.7  PROT 7.1 6.6  ALBUMIN 3.4* 2.9*   Recent Labs  Lab 07/05/21 1633  LIPASE 45   No results for input(s): AMMONIA in the last 168 hours. Coagulation Profile: Recent Labs  Lab 07/05/21 2143  INR 0.9   Cardiac Enzymes: No results for input(s): CKTOTAL, CKMB, CKMBINDEX, TROPONINI in the last 168 hours. BNP (last 3 results) No results for input(s): PROBNP in the last 8760 hours. HbA1C: No results for input(s): HGBA1C in the last 72 hours. CBG: No results for input(s): GLUCAP in the last 168 hours. Lipid Profile: No results for input(s): CHOL, HDL, LDLCALC, TRIG, CHOLHDL, LDLDIRECT in the last 72 hours. Thyroid Function Tests: No results for input(s): TSH, T4TOTAL, FREET4, T3FREE, THYROIDAB in the last 72 hours. Anemia Panel: No results for input(s): VITAMINB12, FOLATE, FERRITIN, TIBC, IRON, RETICCTPCT in the last 72 hours. Sepsis Labs: No results for input(s): PROCALCITON, LATICACIDVEN in the last 168 hours.  Recent Results (from the past 240 hour(s))  Resp Panel by RT-PCR (Flu A&B, Covid) Nasopharyngeal Swab     Status: None   Collection Time: 07/05/21  9:09 PM  Specimen: Nasopharyngeal Swab; Nasopharyngeal(NP) swabs in vial transport medium  Result Value Ref Range Status   SARS Coronavirus 2 by RT PCR NEGATIVE NEGATIVE Final    Comment: (NOTE) SARS-CoV-2 target nucleic acids are NOT DETECTED.  The SARS-CoV-2 RNA is generally detectable in upper respiratory specimens during the acute phase of infection. The lowest concentration of SARS-CoV-2 viral copies this assay can detect is 138 copies/mL. A negative result does not preclude SARS-Cov-2 infection and should not be used as the sole basis for treatment or other patient management decisions. A negative result may occur with  improper specimen collection/handling, submission of specimen other than nasopharyngeal swab, presence of  viral mutation(s) within the areas targeted by this assay, and inadequate number of viral copies(<138 copies/mL). A negative result must be combined with clinical observations, patient history, and epidemiological information. The expected result is Negative.  Fact Sheet for Patients:  EntrepreneurPulse.com.au  Fact Sheet for Healthcare Providers:  IncredibleEmployment.be  This test is no t yet approved or cleared by the [place] FDA and  has been authorized for detection and/or diagnosis of SARS-CoV-2 by FDA under an Emergency Use Authorization (EUA). This EUA will remain  in effect (meaning this test can be used) for the duration of the COVID-19 declaration under Section 564(b)(1) of the Act, 21 U.S.C.section 360bbb-3(b)(1), unless the authorization is terminated  or revoked sooner.       Influenza A by PCR NEGATIVE NEGATIVE Final   Influenza B by PCR NEGATIVE NEGATIVE Final    Comment: (NOTE) The Xpert Xpress SARS-CoV-2/FLU/RSV plus assay is intended as an aid in the diagnosis of influenza from Nasopharyngeal swab specimens and should not be used as a sole basis for treatment. Nasal washings and aspirates are unacceptable for Xpert Xpress SARS-CoV-2/FLU/RSV testing.  Fact Sheet for Patients: EntrepreneurPulse.com.au  Fact Sheet for Healthcare Providers: IncredibleEmployment.be  This test is not yet approved or cleared by the [place] FDA and has been authorized for detection and/or diagnosis of SARS-CoV-2 by FDA under an Emergency Use Authorization (EUA). This EUA will remain in effect (meaning this test can be used) for the duration of the COVID-19 declaration under Section 564(b)(1) of the Act, 21 U.S.C. section 360bbb-3(b)(1), unless the authorization is terminated or revoked.  Performed at [facility], [address], [place], [place] [address]          Radiology  Studies: No results found.      Scheduled Meds:  doxepin  50 mg Oral QHS   hydroxychloroquine  200 mg Oral BID   nicotine  21 mg Transdermal Daily   pantoprazole (PROTONIX) IV  40 mg Intravenous Q12H   predniSONE  10 mg Oral Q breakfast   sodium chloride flush  3 mL Intravenous Q12H   Continuous Infusions:  sodium chloride      Assessment & Plan:   Principal Problem:   Hemoptysis Active Problems:   COPD (chronic obstructive pulmonary disease) (HCC)   Coronary artery disease   Abnormal CT of the chest   Hematuria   Nephrolithiasis   Abnormal LFTs   Abnormal CT scan   1.Hemoptysis Likely due to malignancy 12/11 none reported today  CBC stable Continue PPI  PCCM following If continues to have hemoptysis, PCCM may consider nebulized Traxeamic acid if necessary    2.  Metastatic malignancy Patient with extensive smoking history Oncology consulted input was appreciated.  Recommend IR biopsy of peritoneal/omental nodularity Further management plan pending tissue diagnosis 12/11 plan for IR for bx hopefully  tomorrow PCCM input was appreciated.  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 Elevated LFT Etiology unclear. Possibly due to statin and ?mets. Holding statin Repeat labs   6.hematuria None reported. H/h stable UA neg for infection .  Needs to see urology as outpt Avoid nsaids   7.hyponatremia Mild Possibly due to lung ca and hypovolemia On ivf Ck labs    DVT prophylaxis: scd Code Status: Full Family Communication: family updated. Disposition Plan:  Status is: Inpatient  Remains inpatient appropriate because: Due to severity of illness requiring hospitalization. W/u pending            LOS: 3  days   Time spent: 35 min with >50% on coc    Nolberto Hanlon, MD [facility] Pager [phone]  If 7PM-7AM, please contact night-coverage 07/08/2021, 9:00 AM

## 2021-07-09 ENCOUNTER — Encounter: Payer: Self-pay | Admitting: Internal Medicine

## 2021-07-09 ENCOUNTER — Inpatient Hospital Stay: Payer: Medicare HMO

## 2021-07-09 DIAGNOSIS — C7951 Secondary malignant neoplasm of bone: Secondary | ICD-10-CM

## 2021-07-09 DIAGNOSIS — C799 Secondary malignant neoplasm of unspecified site: Secondary | ICD-10-CM

## 2021-07-09 DIAGNOSIS — C3480 Malignant neoplasm of overlapping sites of unspecified bronchus and lung: Secondary | ICD-10-CM | POA: Diagnosis not present

## 2021-07-09 LAB — CBC
HCT: 36.5 % — ABNORMAL LOW (ref 39.0–52.0)
Hemoglobin: 12.4 g/dL — ABNORMAL LOW (ref 13.0–17.0)
MCH: 31.2 pg (ref 26.0–34.0)
MCHC: 34 g/dL (ref 30.0–36.0)
MCV: 91.7 fL (ref 80.0–100.0)
Platelets: 142 10*3/uL — ABNORMAL LOW (ref 150–400)
RBC: 3.98 MIL/uL — ABNORMAL LOW (ref 4.22–5.81)
RDW: 14.6 % (ref 11.5–15.5)
WBC: 9.2 10*3/uL (ref 4.0–10.5)
nRBC: 0 % (ref 0.0–0.2)

## 2021-07-09 LAB — SODIUM: Sodium: 129 mmol/L — ABNORMAL LOW (ref 135–145)

## 2021-07-09 MED ORDER — PANTOPRAZOLE SODIUM 40 MG PO TBEC: 40.0000 mg | DELAYED_RELEASE_TABLET | Freq: Every day | ORAL | 0 refills | Status: DC

## 2021-07-09 MED ORDER — MIDAZOLAM HCL 2 MG/2ML IJ SOLN
INTRAMUSCULAR | Status: AC
  Filled 2021-07-09: qty 2

## 2021-07-09 MED ORDER — MIDAZOLAM HCL 2 MG/2ML IJ SOLN
INTRAMUSCULAR | Status: DC | PRN
  Administered 2021-07-09: .5 mg via INTRAVENOUS

## 2021-07-09 MED ORDER — NICOTINE 21 MG/24HR TD PT24
21.0000 mg | MEDICATED_PATCH | Freq: Every day | TRANSDERMAL | 0 refills | Status: DC
Start: 2021-07-10 — End: 2021-07-26

## 2021-07-09 MED ORDER — FENTANYL CITRATE (PF) 100 MCG/2ML IJ SOLN
INTRAMUSCULAR | Status: AC
  Filled 2021-07-09: qty 2

## 2021-07-09 MED ORDER — ENSURE ENLIVE PO LIQD
237.0000 mL | Freq: Three times a day (TID) | ORAL | Status: DC
  Administered 2021-07-10: 237 mL via ORAL

## 2021-07-09 MED ORDER — ADULT MULTIVITAMIN W/MINERALS CH
1.0000 | ORAL_TABLET | Freq: Every day | ORAL | Status: DC
  Administered 2021-07-10: 1 via ORAL
  Filled 2021-07-09: qty 1

## 2021-07-09 NOTE — Progress Notes (Signed)
   07/09/21 0900  Clinical Encounter Type  Visited With Patient and family together  Visit Type Follow-up  Referral From Nurse  Consult/Referral To Chaplain  Spiritual Encounters  Spiritual Needs Brochure;Literature  Chaplain Ormond completed one AD education for room 208. Chaplain Ormond advised pt's wife to contact our [place] when they are ready to complete to document.

## 2021-07-09 NOTE — Progress Notes (Signed)
Hematology/Oncology Progress Note [phone] [phone]  Frederick Mitchell   DOB:[birth year]   HG#:[id number]   HDQ#:[id number]  History of Presenting Illness: 65 y.o. male with PMH listed at below who presents to ER for evaluation of coughing up blood with clots. Outpatient CT was concerning for wide spread malignancy including disease in chest, abdomen pelvis, lungs, lymphadenopathy, omental nodularity, pericardial disease, adrenal and renal involvement, and bone lesions.  He was admitted to hospital and oncology was consulted to assist with workup. Hemoptysis resolved since admission. He was symptomatic of unintentional weight loss. Extensive smoking history.   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  Hyponatremia  Interval History: Patient underwent CT guided biopsy of left lateral peritoneal mass on 12/12. Feels at baseline. Hemoptysis has resolved. Denies pain. Eager to go home.    Objective:  Vitals:   07/09/21 1336 07/09/21 1540  BP:  119/65  Pulse: 93 92  Resp: 17 18  Temp:  97.8 F (36.6 C)  SpO2: 92% 93%    Body mass index is 25.37 kg/m.  Intake/Output Summary (Last 24 hours) at 07/09/2021 1759 Last data filed at 07/08/2021 1800 Gross per 24 hour  Intake 240 ml  Output --  Net 240 ml   General: Well-developed, well-nourished, no acute distress. Deaf. Spouse at bedside.  Eyes: Pink conjunctiva, anicteric sclera. Lungs: No audible wheezing or coughing. Room air.  Heart: Regular rate and rhythm.  Abdomen: Soft, nontender, nondistended.  Musculoskeletal: No edema, cyanosis, or clubbing. Neuro: Alert, answering all questions appropriately. Cranial nerves grossly intact. Skin: scaly rash. Dry skin.  Psych: Normal affect.  Labs:  Lab Results  Component Value Date   WBC 9.2 07/09/2021   HGB 12.4 (L) 07/09/2021   HCT 36.5 (L) 07/09/2021   MCV 91.7 07/09/2021   PLT 142 (L) 07/09/2021    NEUTROABS 17.4 (H) 09/01/2012   CMP Latest Ref Rng & Units 07/09/2021 07/06/2021 07/05/2021  Glucose 70 - 99 mg/dL - 80 69(L)  BUN 8 - 23 mg/dL - 14 16  Creatinine 0.61 - 1.24 mg/dL - 0.60(L) 0.52(L)  Sodium 135 - 145 mmol/L 129(L) 131(L) 131(L)  Potassium 3.5 - 5.1 mmol/L - 3.9 3.8  Chloride 98 - 111 mmol/L - 98 96(L)  CO2 22 - 32 mmol/L - 26 17(L)  Calcium 8.9 - 10.3 mg/dL - 8.9 9.6  Total Protein 6.5 - 8.1 g/dL - 6.6 7.1  Total Bilirubin 0.3 - 1.2 mg/dL - 0.7 0.6  Alkaline Phos 38 - 126 U/L - 69 73  AST 15 - 41 U/L - 80(H) 93(H)  ALT 0 - 44 U/L - 55(H) 64(H)   Studies:  CT BIOPSY  Result Date: 07/09/2021 CLINICAL DATA:  Dominant left upper lobe/perihilar lung mass with multiple bilateral lung nodules, mediastinal lymphadenopathy, adrenal masses, renal masses and peritoneal masses. The patient presents for peritoneal biopsy to try to establish a tissue diagnosis. EXAM: CT GUIDED CORE BIOPSY OF PERITONEAL ABDOMINAL MASS ANESTHESIA/SEDATION: Formal conscious sedation was not administered as the patient is on oxygen and currently has hemoptysis. He was given 0.5 mg of IV Versed just prior to beginning the procedure for anxiolysis. PROCEDURE: The procedure risks, benefits, and alternatives were explained to the patient. Questions regarding the procedure were encouraged and answered. The patient understands and consents to the procedure. A time-out was performed prior to initiating the procedure. CT was performed through the abdomen in a supine position. The left lateral abdominal wall was prepped with  chlorhexidine in a sterile fashion, and a sterile drape was applied covering the operative field. A sterile gown and sterile gloves were used for the procedure. Local anesthesia was provided with 1% Lidocaine. Under CT guidance, a 30 gauge trocar needle was advanced into the lateral left peritoneal cavity lateral to small bowel and anterior to the descending colon. Coaxial 18 gauge core biopsy  samples were then obtained of peritoneal masses. A total of 3 core biopsy samples were obtained and submitted in formalin. Gel-Foam pledgets were advanced through the outer needle prior to needle retraction and removal. Additional CT was performed. COMPLICATIONS: None FINDINGS: Multiple nodular peritoneal masses are again seen clustered in the lateral left peritoneal cavity at the level of the mid kidneys. Solid tissue samples were obtained. IMPRESSION: CT-guided core biopsy performed of left lateral peritoneal tumor. Electronically Signed   By: Aletta Edouard M.D.   On: 07/09/2021 14:44    07/05/21- CT Chest Abdomen Pelvis W Contrast Diffuse adenopathy throughout the mediastinum with heterogeneous and necrotic appearance. LEFT paratracheal mass 3.8 x 3.3 cm  RIGHT paratracheal/peribronchial lymph node (image 30/2) 16 mm short axis Numerous pulmonary nodules, too numerous to count. Largest in the LEFT upper lobe invading mediastinal fat measuring 3.6 x 3.0 cm, reportedly 1.6 x 1.0 cm on previous imaging assuming this is the same nodule. Central mass, more likely LEFT juxta hilar suprahilar adenopathy 3.0 x 3.4 cm Destructive and expansile rib lesion (image 50/3) 2.5 x 2.1 cm involving the LEFT posterior fifth rib Metastatic disease throughout the chest, abdomen and pelvis involving lungs, lymph nodes, viscera and bone as discussed. Distribution and appearance would favor pulmonary primary with metastatic disease.  Bilateral adrenal and renal involvement. Infiltrative process in the bilateral kidneys could certainly be seen in the setting of pulmonary metastasis. Biopsy may be helpful given the diffuse nature of disease and the presence of bilateral infiltrative renal lesions. Comparison with prior imaging could also be helpful.   Bilateral nephrolithiasis. Signs of extensive vascular disease with chronic appearing aorto iliac occlusive disease Three-vessel coronary artery disease.  Aortic  atherosclerosis. Emphysema   Assessment & Plan: 65 y.o. with metastatic malignancy, likely lung primary, status post omental biopsy, currently admitted for hemoptysis. Awaiting tissue diagnosis.  If lung primary would recommend MRI of the brain w wo contrast for staging. Reviewed very general treatment recommendations would include chemotherapy along with supportive care. Patient is interested in considering treatment options.  He wishes to go home and from oncology perspective the remainder of his workup can be done outpatient. Prior to discharge, I'd schedule him for outpatient visit. He requests in person interpreter for that visit.   Thank you for allowing me to participate in the care of this patient. Due to a language barrier, a virtual interpreter participated in all patient interactions.   Verlon Au, NP 07/09/2021  6:01 PM [facility]

## 2021-07-09 NOTE — Progress Notes (Signed)
PROGRESS NOTE    Frederick Mitchell  POE:[id number] DOB: Nov 24, 1955 DOA: 07/05/2021 PCP: Gayland Curry, MD    Brief Narrative:  Patient with diagnosis of lung cancer presents with 8 episodes of coughing up blood with blood-streaked sputum x8 yesterday. Takes NSAIDs prn. None last week, only took tylenol.   12/11 no further hemoptysis 12/12 had some hemoptysis today. Going to IR bx today. Sister , wife, and another person in the room. Used [employer] . Nsg stated they have been communicating with the pt by writing pad.   Consultants:   oncology  Procedures:   Antimicrobials:      Subjective: Denies sob, cp, dizziness   Objective: Vitals:   07/09/21 1254 07/09/21 1259 07/09/21 1304 07/09/21 1309  BP: 101/66 114/67 113/64   Pulse: 83 85 85 84  Resp: 18 19 19 18   Temp:      TempSrc:      SpO2: 90% 90% 92% 92%  Weight:      Height:        Intake/Output Summary (Last 24 hours) at 07/09/2021 1315 Last data filed at 07/08/2021 1800 Gross per 24 hour  Intake 240 ml  Output --  Net 240 ml   Filed Weights   07/05/21 1631 07/09/21 1215  Weight: 73.5 kg 73.5 kg    Examination: NAD, calm CTA no wheeze Regular S1-S2 no gallops Soft benign positive bowel sounds No edema Mood and affect appropriate in current setting    Data Reviewed: I have personally reviewed following labs and imaging studies  CBC: Recent Labs  Lab 07/05/21 1633 07/06/21 0214 07/06/21 2055 07/09/21 0243  WBC 9.6 8.9  --  9.2  HGB 13.8 13.1 13.3 12.4*  HCT 40.9 39.1 39.5 36.5*  MCV 92.5 93.1  --  91.7  PLT 178 167  --  315*   Basic Metabolic Panel: Recent Labs  Lab 07/05/21 1633 07/06/21 1421 07/09/21 0243  NA 131* 131* 129*  K 3.8 3.9  --   CL 96* 98  --   CO2 17* 26  --   GLUCOSE 69* 80  --   BUN 16 14  --   CREATININE 0.52* 0.60*  --   CALCIUM 9.6 8.9  --    GFR: Estimated Creatinine Clearance: 83.8 mL/min (A) (by C-G formula based on SCr of 0.6 mg/dL  (L)). Liver Function Tests: Recent Labs  Lab 07/05/21 1633 07/06/21 1421  AST 93* 80*  ALT 64* 55*  ALKPHOS 73 69  BILITOT 0.6 0.7  PROT 7.1 6.6  ALBUMIN 3.4* 2.9*   Recent Labs  Lab 07/05/21 1633  LIPASE 45   No results for input(s): AMMONIA in the last 168 hours. Coagulation Profile: Recent Labs  Lab 07/05/21 2143  INR 0.9   Cardiac Enzymes: No results for input(s): CKTOTAL, CKMB, CKMBINDEX, TROPONINI in the last 168 hours. BNP (last 3 results) No results for input(s): PROBNP in the last 8760 hours. HbA1C: No results for input(s): HGBA1C in the last 72 hours. CBG: No results for input(s): GLUCAP in the last 168 hours. Lipid Profile: No results for input(s): CHOL, HDL, LDLCALC, TRIG, CHOLHDL, LDLDIRECT in the last 72 hours. Thyroid Function Tests: No results for input(s): TSH, T4TOTAL, FREET4, T3FREE, THYROIDAB in the last 72 hours. Anemia Panel: No results for input(s): VITAMINB12, FOLATE, FERRITIN, TIBC, IRON, RETICCTPCT in the last 72 hours. Sepsis Labs: No results for input(s): PROCALCITON, LATICACIDVEN in the last 168 hours.  Recent Results (from the past 240 hour(s))  Resp  Panel by RT-PCR (Flu A&B, Covid) Nasopharyngeal Swab     Status: None   Collection Time: 07/05/21  9:09 PM   Specimen: Nasopharyngeal Swab; Nasopharyngeal(NP) swabs in vial transport medium  Result Value Ref Range Status   SARS Coronavirus 2 by RT PCR NEGATIVE NEGATIVE Final    Comment: (NOTE) SARS-CoV-2 target nucleic acids are NOT DETECTED.  The SARS-CoV-2 RNA is generally detectable in upper respiratory specimens during the acute phase of infection. The lowest concentration of SARS-CoV-2 viral copies this assay can detect is 138 copies/mL. A negative result does not preclude SARS-Cov-2 infection and should not be used as the sole basis for treatment or other patient management decisions. A negative result may occur with  improper specimen collection/handling, submission of specimen  other than nasopharyngeal swab, presence of viral mutation(s) within the areas targeted by this assay, and inadequate number of viral copies(<138 copies/mL). A negative result must be combined with clinical observations, patient history, and epidemiological information. The expected result is Negative.  Fact Sheet for Patients:  EntrepreneurPulse.com.au  Fact Sheet for Healthcare Providers:  IncredibleEmployment.be  This test is no t yet approved or cleared by the [place] FDA and  has been authorized for detection and/or diagnosis of SARS-CoV-2 by FDA under an Emergency Use Authorization (EUA). This EUA will remain  in effect (meaning this test can be used) for the duration of the COVID-19 declaration under Section 564(b)(1) of the Act, 21 U.S.C.section 360bbb-3(b)(1), unless the authorization is terminated  or revoked sooner.       Influenza A by PCR NEGATIVE NEGATIVE Final   Influenza B by PCR NEGATIVE NEGATIVE Final    Comment: (NOTE) The Xpert Xpress SARS-CoV-2/FLU/RSV plus assay is intended as an aid in the diagnosis of influenza from Nasopharyngeal swab specimens and should not be used as a sole basis for treatment. Nasal washings and aspirates are unacceptable for Xpert Xpress SARS-CoV-2/FLU/RSV testing.  Fact Sheet for Patients: EntrepreneurPulse.com.au  Fact Sheet for Healthcare Providers: IncredibleEmployment.be  This test is not yet approved or cleared by the [place] FDA and has been authorized for detection and/or diagnosis of SARS-CoV-2 by FDA under an Emergency Use Authorization (EUA). This EUA will remain in effect (meaning this test can be used) for the duration of the COVID-19 declaration under Section 564(b)(1) of the Act, 21 U.S.C. section 360bbb-3(b)(1), unless the authorization is terminated or revoked.  Performed at [facility], [address],  [place], [place] [address]          Radiology Studies: No results found.      Scheduled Meds:  doxepin  50 mg Oral QHS   fentaNYL       hydroxychloroquine  200 mg Oral BID   midazolam       nicotine  21 mg Transdermal Daily   pantoprazole (PROTONIX) IV  40 mg Intravenous Q12H   predniSONE  10 mg Oral Q breakfast   sodium chloride flush  3 mL Intravenous Q12H   Continuous Infusions:  sodium chloride      Assessment & Plan:   Principal Problem:   Hemoptysis Active Problems:   COPD (chronic obstructive pulmonary disease) (HCC)   Coronary artery disease   Abnormal CT of the chest   Hematuria   Nephrolithiasis   Abnormal LFTs   Abnormal CT scan   1.Hemoptysis Likely due to malignancy H&H stable mildly down PCCM following.  Pulmonology can follow-up as outpatient.   Continue PPI    2.  Metastatic malignancy Patient  with extensive smoking history Oncology consulted input was appreciated.  Recommend IR biopsy of peritoneal/omental nodularity Further management plan pending tissue diagnosis 12/12 plan for IR for biopsy today  If IR unable to perform biopsy we will need to do bronchoscopy    3. Hyponatremia Likely from lung mass Monitor.  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 Elevated LFT Etiology unclear. Possibly due to statin and ?mets. Holding statin Repeat labs   8.hematuria None reported. H/h stable UA neg for infection .  Needs to see urology as outpt Avoid nsaids     DVT prophylaxis: scd Code Status: Full Family Communication: sister, wife, another person all at bedside . All questions were addressed. Disposition Plan:  Status is: Inpatient  Remains inpatient appropriate because: Due to severity of illness requiring hospitalization. W/u pending             LOS: 4 days   Time spent: 35 min with >50% on coc    Nolberto Hanlon, MD [facility] Pager [phone]  If 7PM-7AM, please contact night-coverage 07/09/2021, 1:15 PM

## 2021-07-09 NOTE — Procedures (Signed)
Interventional Radiology Procedure Note  Procedure: CT Guided Biopsy of left lateral peritoneal mass  Complications: None  Estimated Blood Loss: < 10 mL  Findings: 18 G core biopsy of left peritoneal mass performed under CT guidance.  Three core samples obtained and sent to Pathology.  Venetia Night. Kathlene Cote, M.D Pager:  [phone]

## 2021-07-09 NOTE — Progress Notes (Signed)
PULMONOLOGY         Date: 07/09/2021,   MRN# [id number] Frederick Mitchell 04-18-1956     AdmissionWeight: 73.5 kg                 CurrentWeight: 73.5 kg   Referring physician: Dr Kurtis Bushman   CHIEF COMPLAINT:   Non-Massive hemoptysis   HISTORY OF PRESENT ILLNESS   This is a [name] with hx of CAD, COPD, congenital left hip dysplasia, nephrolithiasis, hearing impariment, Lifelong smoking, metabolic syndrome.  He had CT chest with left lung mass noted and hemoptysis. He was seen by oncology and via help from sign language interpreter was able to report constitutional symptoms including weight loss unintentionally, severe fatigue, and has exensive history of cancer in immediate family. PCCM consultation for further evaluation and management with lung mass and hemoptysis. CT abd was reviewed by me with multiple metastatic lung lesions noted bilaterally in lungs with dominant left upper primary.There is also distant mets per report.   07/09/21- patient for biopsy via IR today.  He is stable.  Pulmonology can follow up on outpatient.  Recommed dc home with additional investigation post pathology results to be done with medical oncology.   PAST MEDICAL HISTORY   Past Medical History:  Diagnosis Date   Deaf    Hypercholesteremia unk     SURGICAL HISTORY   Past Surgical History:  Procedure Laterality Date   EYE SURGERY       FAMILY HISTORY   Family History  Problem Relation Age of Onset   Cancer Father    Cancer Sister    Cancer Maternal Uncle    Cancer Paternal Aunt    Cancer Maternal Grandmother    Cancer Maternal Grandfather      SOCIAL HISTORY   Social History   Tobacco Use   Smoking status: Every Day    Types: Cigarettes   Smokeless tobacco: Never  Substance Use Topics   Alcohol use: No   Drug use: Not Currently     MEDICATIONS    Home Medication:    Current Medication:  Current Facility-Administered Medications:    0.9 %  sodium chloride  infusion, 250 mL, Intravenous, PRN, Para Skeans, MD   doxepin (SINEQUAN) capsule 50 mg, 50 mg, Oral, QHS, Florina Ou V, MD, 50 mg at 07/08/21 2155   fluticasone (FLONASE) 50 MCG/ACT nasal spray 1 spray, 1 spray, Each Nare, Daily PRN, Ottie Glazier, MD   hydroxychloroquine (PLAQUENIL) tablet 200 mg, 200 mg, Oral, BID, Florina Ou V, MD, 200 mg at 07/09/21 1540   hydrOXYzine (ATARAX) tablet 10 mg, 10 mg, Oral, TID PRN, Para Skeans, MD   nicotine (NICODERM CQ - dosed in mg/24 hours) patch 21 mg, 21 mg, Transdermal, Daily, Posey Pronto, Ekta V, MD   pantoprazole (PROTONIX) injection 40 mg, 40 mg, Intravenous, Q12H, Florina Ou V, MD, 40 mg at 07/09/21 0902   phenol (CHLORASEPTIC) mouth spray 1 spray, 1 spray, Mouth/Throat, PRN, Nolberto Hanlon, MD, 1 spray at 07/07/21 1148   predniSONE (DELTASONE) tablet 10 mg, 10 mg, Oral, Q breakfast, Dallie Piles, RPH, 10 mg at 07/09/21 0901   sodium chloride flush (NS) 0.9 % injection 3 mL, 3 mL, Intravenous, Q12H, Florina Ou V, MD, 3 mL at 07/09/21 0904   sodium chloride flush (NS) 0.9 % injection 3 mL, 3 mL, Intravenous, PRN, Para Skeans, MD    ALLERGIES   Patient has no known allergies.     REVIEW  OF SYSTEMS    Review of Systems:  Gen:  Denies  fever, sweats, chills weigh loss  HEENT: Denies blurred vision, double vision, ear pain, eye pain, hearing loss, nose bleeds, sore throat Cardiac:  No dizziness, chest pain or heaviness, chest tightness,edema Resp:   Denies cough or sputum porduction, shortness of breath,wheezing, hemoptysis,  Gi: Denies swallowing difficulty, stomach pain, nausea or vomiting, diarrhea, constipation, bowel incontinence Gu:  Denies bladder incontinence, burning urine Ext:   Denies Joint pain, stiffness or swelling Skin: Denies  skin rash, easy bruising or bleeding or hives Endoc:  Denies polyuria, polydipsia , polyphagia or weight change Psych:   Denies depression, insomnia or hallucinations   Other:  All other  systems negative   VS: BP 116/63 (BP Location: Left Arm)   Pulse 89   Temp 97.9 F (36.6 C) (Oral)   Resp 18   Ht 5\' 7"  (1.702 m)   Wt 73.5 kg   SpO2 95%   BMI 25.37 kg/m      PHYSICAL EXAM    GENERAL:NAD, no fevers, chills, no weakness no fatigue HEAD: Normocephalic, atraumatic.  EYES: Pupils equal, round, reactive to light. Extraocular muscles intact. No scleral icterus.  MOUTH: Moist mucosal membrane. Dentition intact. No abscess noted.  EAR, NOSE, THROAT: Clear without exudates. No external lesions.  NECK: Supple. No thyromegaly. No nodules. No JVD.  PULMONARY: Diffuse coarse rhonchi right sided +wheezes CARDIOVASCULAR: S1 and S2. Regular rate and rhythm. No murmurs, rubs, or gallops. No edema. Pedal pulses 2+ bilaterally.  GASTROINTESTINAL: Soft, nontender, nondistended. No masses. Positive bowel sounds. No hepatosplenomegaly.  MUSCULOSKELETAL: No swelling, clubbing, or edema. Range of motion full in all extremities.  NEUROLOGIC: Cranial nerves II through XII are intact. No gross focal neurological deficits. Sensation intact. Reflexes intact.  SKIN: No ulceration, lesions, rashes, or cyanosis. Skin warm and dry. Turgor intact.  PSYCHIATRIC: Mood, affect within normal limits. The patient is awake, alert and oriented x 3. Insight, judgment intact.       IMAGING    CT CHEST ABDOMEN PELVIS W CONTRAST  Result Date: 07/05/2021 CLINICAL DATA:  [name] presents for follow-up of suspected pulmonary neoplasm. EXAM: CT CHEST, ABDOMEN, AND PELVIS WITH CONTRAST TECHNIQUE: Multidetector CT imaging of the chest, abdomen and pelvis was performed following the standard protocol during bolus administration of intravenous contrast. CONTRAST:  166mL OMNIPAQUE IOHEXOL 300 MG/ML  SOLN COMPARISON:  Most recent comparison imaging of the chest are more remote chest x-rays. No direct comparison imaging is available with CT. Report from [place] [facility] references a 1.6 x  1.00 cm pulmonary nodule in the LEFT upper lobe. FINDINGS: CT CHEST FINDINGS Cardiovascular: Calcified and noncalcified atheromatous plaque of the thoracic aorta. Normal heart size. Three-vessel coronary artery disease. Mild engorgement of central pulmonary vasculature. Mediastinum/Nodes: Diffuse adenopathy throughout the mediastinum with heterogeneous and necrotic appearance. LEFT paratracheal mass 3.8 x 3.3 cm (image 26/2) AP window lymph node 2.3 cm short axis. RIGHT paratracheal/peribronchial lymph node (image 30/2) 16 mm short axis. Cystic lesion in the anterior mediastinum reported as benign on previous imaging, no comparison available. Pre-vascular lymph node adjacent to a LEFT upper lobe mass (image 20/2) pre-vascular lymph node at 13 mm. No thoracic inlet adenopathy. Is no axillary adenopathy. Lungs/Pleura: Numerous pulmonary nodules, too numerous to count. Largest in the LEFT upper lobe invading mediastinal fat measuring 3.6 x 3.0 cm, reportedly 1.6 x 1.0 cm on previous imaging assuming this is the same nodule. Central mass, more likely LEFT  juxta hilar suprahilar adenopathy 3.0 x 3.4 cm (image 69/3) LEFT upper lobe pulmonary nodule 11 mm (image 73/3) Numerous additional pulmonary nodules on the LEFT and RIGHT. Largest in the LEFT lower lobe 2.6 x 2.3 cm. Largest in the RIGHT lower lobe (image 132/3) 2.1 cm. Airways are patent aside from LEFT upper lobe bronchials which are narrowed, narrowing of central bronchial structures. No sign of pleural effusion. No substantial septal thickening but some subtle nodularity along the fissure in the LEFT chest is noted. Musculoskeletal: Destructive and expansile rib lesion (image 50/3) 2.5 x 2.1 cm involving the LEFT posterior fifth rib. See below for full musculoskeletal details. CT ABDOMEN PELVIS FINDINGS Hepatobiliary: Surface nodularity along the entire surface of the RIGHT hemi liver peripherally and in more since pouch subcentimeter nodules throughout this  location example seen on image 74 of series 2. No parenchymal lesion with suspicious features, small cysts suspected in the LEFT hepatic lobe. Lobular hepatic contours with fissural widening. Portal vein is patent. Pancreas: Normal, without mass, inflammation or ductal dilatation. Mass adjacent to the pancreas likely within the lesser sac. Extensive serosal and peritoneal nodularity throughout the abdomen, see below. No pancreatic ductal dilation or sign of inflammation. Spleen: Spleen normal size and contour. Adrenals/Urinary Tract: Signs of adrenal metastases bilaterally. LEFT adrenal mass (image 61/2) 3.8 x 3.6 cm. RIGHT adrenal mass (image 60/2) 2.5 cm short axis. Infiltrative parenchymal involvement of the RIGHT and LEFT kidney, on the RIGHT 6.8 x 5.0 cm and on the LEFT 6.1 cm greatest axial dimension. Surface nodularity along the renal contour, on the RIGHT, for example 3.1 cm nodule along the upper pole the RIGHT kidney. Additional areas of heterogeneity in the kidneys smaller and more subtle. No hydronephrosis. Signs of nephrolithiasis with branched calculi, largest in the lower pole on the LEFT 2.7 x 1.8 cm. This extends into the infundibulum and peripheral aspect of the renal sinus. Two smaller calculi in the lower and interpolar LEFT kidney measuring up to 1.6 cm. Smaller calculi seen on the RIGHT largest 9 mm. Stomach/Bowel: Serosal involvement of the gastric antrum (image 69/2) 2.3 cm. Smaller areas of serosal nodularity along the stomach in this location tracking towards the pancreas. No signs of bowel obstruction or acute bowel process in the setting of extensive peritoneal disease Vascular/Lymphatic: Severe vascular disease. 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 Contrast seen distal to this level in the iliac vasculature. Heavily calcified iliac vessels and plicae that this is a chronic process. Bulky retroperitoneal adenopathy (image 69/2) retrocaval lymph  node 2.1 cm. LEFT para-aortic adenopathy for example (image 71/2) 1.6 cm. Intra-aortocaval adenopathy (image 85/2) 1.3 cm. Scattered smaller lymph nodes throughout the retroperitoneum. No pelvic adenopathy. Reproductive: Unremarkable by CT. Other: Extensive peritoneal disease and omental nodularity. No ascites. For example anterior to the transverse colon on image 86/2 is a 2.9 cm peritoneal nodule. Surface nodularity in more since pouch along the under surface of the RIGHT hemi liver as described. Omental nodule in the LEFT upper quadrant (image 75/2) 3.2 cm. Smaller nodules too numerous to count throughout the anterior abdomen elsewhere. Musculoskeletal: Destructive rib lesion in the LEFT chest as described. Lucent area in the LEFT inferior pubic ramus with ill-defined enhancement along the margin of the pubic bone at this level (image 121/2) 3.6 x 1.1 cm. Destructive LEFT acetabular lesion (image 109/2) 2.0 x 1.6 cm. Spinal degenerative changes. IMPRESSION: Metastatic disease throughout the chest, abdomen and pelvis involving lungs, lymph nodes, viscera and bone  as discussed. Distribution and appearance would favor pulmonary primary with metastatic disease. Bilateral adrenal and renal involvement. Infiltrative process in the bilateral kidneys could certainly be seen in the setting of pulmonary metastasis. Biopsy may be helpful given the diffuse nature of disease and the presence of bilateral infiltrative renal lesions. Comparison with prior imaging could also be helpful. Bilateral nephrolithiasis. Signs of extensive vascular disease with chronic appearing aorto iliac occlusive disease Three-vessel coronary artery disease. Aortic atherosclerosis. Aortic Atherosclerosis (ICD10-I70.0) and Emphysema (ICD10-J43.9). Electronically Signed   By: Zetta Bills M.D.   On: 07/05/2021 15:41   DG Chest Portable 1 View  Result Date: 07/05/2021 CLINICAL DATA:  Hemoptysis EXAM: PORTABLE CHEST 1 VIEW COMPARISON:  CT from  earlier in the same day. FINDINGS: Cardiac shadow is within normal limits. Soft tissue density is noted in the medial aspect of the left apex consistent with the mass lesion seen on recent CT examination. Fullness in the left hilum is again noted. Scattered pulmonary nodules are noted similar to that seen on prior CT. No acute bony abnormality is noted. Postsurgical changes in the cervical spine are noted. IMPRESSION: Changes similar to that seen on recent CT examination. No acute abnormality noted. Electronically Signed   By: Inez Catalina M.D.   On: 07/05/2021 21:24      ASSESSMENT/PLAN   Left lung mass with distant metastatic lesions - agree with oncology evaluation - likely metastatic lung cancer - IR biopsy ordered -If IR unable to perform biopsy we will be able to do bronchoscopy  -I spoke to sister of patient Jeannene Patella) and patient with wife.  He wants to go home but his wife and sister want him to stay for biopsy.  I will call palliative and he can discuss what he wants to do. I recommend at least getting an answer as to what this is prior.    Non-massive hemoptysis  -patient is on flonase which often causes epistaxis and we can dc this for now -currently with no ongoing hemptysis will continue to monitor for recurrence -no anticoagulation on MAR or antiplatelet agents  -will consider nebulized traxeamic acid if necessary    Advanced Centrilobular emphysema with COPD -Agree with prednisone and nebulizer therapy    Thank you for allowing me to participate in the care of this patient.  Total face to face encounter time for this patient visit was >56min. >50% of the time was  spent in counseling and coordination of care.   Patient/Family are satisfied with care plan and all questions have been answered.  This document was prepared using Dragon voice recognition software and may include unintentional dictation errors.     Ottie Glazier, M.D.  Division of [place]

## 2021-07-09 NOTE — Consult Note (Signed)
Chief Complaint: Patient was seen in consultation today for left peritoneal mass at the request of  Nolberto Hanlon, MD   Referring Physician(s): Nolberto Hanlon, MD   Supervising Physician: Aletta Edouard  Patient Status: [place] - In-pt  History of Present Illness: Frederick Mitchell is a 65 y.o. male with admission secondary to hemoptysis and unintentional weight loss with difficulty swallowing. CT imaging with findings of left lung mass and metastatic disease. Oncology and Pulmonology has seen the patient with request for IR biopsy. Patient denies any worsening chest pain or shortness of breath outside of his normal but does still complain of hemoptysis that occurs when he falls asleep. He denies any known bleeding or clotting disorder and denies any recent blood thinner use.   Past Medical History:  Diagnosis Date   Deaf    Hypercholesteremia unk    Past Surgical History:  Procedure Laterality Date   EYE SURGERY      Allergies: Patient has no known allergies.  Medications: Prior to Admission medications   Medication Sig Start Date End Date Taking? Authorizing Provider  atorvastatin (LIPITOR) 80 MG tablet Take 80 mg by mouth daily. 05/31/21 05/31/22 Yes [provider]  carboxymethylcellulose (REFRESH PLUS) 0.5 % SOLN Apply to eye.   Yes [provider]  clobetasol (TEMOVATE) 0.05 % external solution Apply 1 application topically 2 (two) times daily. 06/17/21  Yes [provider]  doxepin (SINEQUAN) 50 MG capsule Take 50 mg by mouth at bedtime.   Yes [provider]  fluticasone (FLONASE) 50 MCG/ACT nasal spray SHAKE LIQUID AND USE 2 SPRAYS IN EACH NOSTRIL EVERY DAY 11/17/19  Yes [provider]  Fluticasone-Umeclidin-Vilant (TRELEGY ELLIPTA) 100-62.5-25 MCG/ACT AEPB Inhale 2 sprays into the lungs 2 (two) times daily. 01/11/20  Yes [provider]  hydroxychloroquine (PLAQUENIL) 200 MG tablet Take 200 mg by mouth 2 (two) times  daily. 06/11/21  Yes [provider]  hydrOXYzine (ATARAX) 10 MG tablet Take 10 mg by mouth 3 (three) times daily as needed. 06/25/21 06/25/22 Yes [provider]  predniSONE (DELTASONE) 10 MG tablet Take 10 mg by mouth daily. 01/16/21  Yes [provider]  triamcinolone (KENALOG) 0.025 % cream Apply 1 application topically 3 (three) times daily.   Yes [provider]  Vitamin D, Ergocalciferol, (DRISDOL) 1.25 MG (50000 UNIT) CAPS capsule Take 50,000 Units by mouth 3 (three) times a week. 05/16/21  Yes [provider]     Family History  Problem Relation Age of Onset   Cancer Father    Cancer Sister    Cancer Maternal Uncle    Cancer Paternal Aunt    Cancer Maternal Grandmother    Cancer Maternal Grandfather     Social History   Socioeconomic History   Marital status: Married    Spouse name: Not on file   Number of children: Not on file   Years of education: Not on file   Highest education level: Not on file  Occupational History   Not on file  Tobacco Use   Smoking status: Every Day    Types: Cigarettes   Smokeless tobacco: Never  Substance and Sexual Activity   Alcohol use: No   Drug use: Not Currently   Sexual activity: Not Currently    Birth control/protection: None  Other Topics Concern   Not on file  Social History Narrative   Not on file   Social Determinants of Health   Financial Resource Strain: Not on file  Food Insecurity:  Not on file  Transportation Needs: Not on file  Physical Activity: Not on file  Stress: Not on file  Social Connections: Not on file    Review of Systems: A 12 point ROS discussed and pertinent positives are indicated in the HPI above.  All other systems are negative.  Review of Systems  Vital Signs: BP 107/60   Pulse 86   Temp 97.9 F (36.6 C) (Oral)   Resp 20   Ht 5\' 7"  (1.702 m)   Wt 162 lb (73.5 kg)   SpO2 94%   BMI 25.37 kg/m   Physical Exam Constitutional:       Appearance: Normal appearance.  HENT:     Head: Normocephalic and atraumatic.  Cardiovascular:     Rate and Rhythm: Normal rate and regular rhythm.  Pulmonary:     Effort: Pulmonary effort is normal.     Breath sounds: Wheezing and rhonchi present.  Neurological:     Mental Status: He is alert and oriented to person, place, and time.    Imaging: CT CHEST ABDOMEN PELVIS W CONTRAST  Result Date: 07/05/2021 CLINICAL DATA:  [name] presents for follow-up of suspected pulmonary neoplasm. EXAM: CT CHEST, ABDOMEN, AND PELVIS WITH CONTRAST TECHNIQUE: Multidetector CT imaging of the chest, abdomen and pelvis was performed following the standard protocol during bolus administration of intravenous contrast. CONTRAST:  136mL OMNIPAQUE IOHEXOL 300 MG/ML  SOLN COMPARISON:  Most recent comparison imaging of the chest are more remote chest x-rays. No direct comparison imaging is available with CT. Report from [facility] references a 1.6 x 1.00 cm pulmonary nodule in the LEFT upper lobe. FINDINGS: CT CHEST FINDINGS Cardiovascular: Calcified and noncalcified atheromatous plaque of the thoracic aorta. Normal heart size. Three-vessel coronary artery disease. Mild engorgement of central pulmonary vasculature. Mediastinum/Nodes: Diffuse adenopathy throughout the mediastinum with heterogeneous and necrotic appearance. LEFT paratracheal mass 3.8 x 3.3 cm (image 26/2) AP window lymph node 2.3 cm short axis. RIGHT paratracheal/peribronchial lymph node (image 30/2) 16 mm short axis. Cystic lesion in the anterior mediastinum reported as benign on previous imaging, no comparison available. Pre-vascular lymph node adjacent to a LEFT upper lobe mass (image 20/2) pre-vascular lymph node at 13 mm. No thoracic inlet adenopathy. Is no axillary adenopathy. Lungs/Pleura: Numerous pulmonary nodules, too numerous to count. Largest in the LEFT upper lobe invading mediastinal fat measuring 3.6 x 3.0 cm,  reportedly 1.6 x 1.0 cm on previous imaging assuming this is the same nodule. Central mass, more likely LEFT juxta hilar suprahilar adenopathy 3.0 x 3.4 cm (image 69/3) LEFT upper lobe pulmonary nodule 11 mm (image 73/3) Numerous additional pulmonary nodules on the LEFT and RIGHT. Largest in the LEFT lower lobe 2.6 x 2.3 cm. Largest in the RIGHT lower lobe (image 132/3) 2.1 cm. Airways are patent aside from LEFT upper lobe bronchials which are narrowed, narrowing of central bronchial structures. No sign of pleural effusion. No substantial septal thickening but some subtle nodularity along the fissure in the LEFT chest is noted. Musculoskeletal: Destructive and expansile rib lesion (image 50/3) 2.5 x 2.1 cm involving the LEFT posterior fifth rib. See below for full musculoskeletal details. CT ABDOMEN PELVIS FINDINGS Hepatobiliary: Surface nodularity along the entire surface of the RIGHT hemi liver peripherally and in more since pouch subcentimeter nodules throughout this location example seen on image 74 of series 2. No parenchymal lesion with suspicious features, small cysts suspected in the LEFT hepatic lobe. Lobular hepatic contours with fissural widening. Portal vein  is patent. Pancreas: Normal, without mass, inflammation or ductal dilatation. Mass adjacent to the pancreas likely within the lesser sac. Extensive serosal and peritoneal nodularity throughout the abdomen, see below. No pancreatic ductal dilation or sign of inflammation. Spleen: Spleen normal size and contour. Adrenals/Urinary Tract: Signs of adrenal metastases bilaterally. LEFT adrenal mass (image 61/2) 3.8 x 3.6 cm. RIGHT adrenal mass (image 60/2) 2.5 cm short axis. Infiltrative parenchymal involvement of the RIGHT and LEFT kidney, on the RIGHT 6.8 x 5.0 cm and on the LEFT 6.1 cm greatest axial dimension. Surface nodularity along the renal contour, on the RIGHT, for example 3.1 cm nodule along the upper pole the RIGHT kidney. Additional areas of  heterogeneity in the kidneys smaller and more subtle. No hydronephrosis. Signs of nephrolithiasis with branched calculi, largest in the lower pole on the LEFT 2.7 x 1.8 cm. This extends into the infundibulum and peripheral aspect of the renal sinus. Two smaller calculi in the lower and interpolar LEFT kidney measuring up to 1.6 cm. Smaller calculi seen on the RIGHT largest 9 mm. Stomach/Bowel: Serosal involvement of the gastric antrum (image 69/2) 2.3 cm. Smaller areas of serosal nodularity along the stomach in this location tracking towards the pancreas. No signs of bowel obstruction or acute bowel process in the setting of extensive peritoneal disease Vascular/Lymphatic: Severe vascular disease. 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 Contrast seen distal to this level in the iliac vasculature. Heavily calcified iliac vessels and plicae that this is a chronic process. Bulky retroperitoneal adenopathy (image 69/2) retrocaval lymph node 2.1 cm. LEFT para-aortic adenopathy for example (image 71/2) 1.6 cm. Intra-aortocaval adenopathy (image 85/2) 1.3 cm. Scattered smaller lymph nodes throughout the retroperitoneum. No pelvic adenopathy. Reproductive: Unremarkable by CT. Other: Extensive peritoneal disease and omental nodularity. No ascites. For example anterior to the transverse colon on image 86/2 is a 2.9 cm peritoneal nodule. Surface nodularity in more since pouch along the under surface of the RIGHT hemi liver as described. Omental nodule in the LEFT upper quadrant (image 75/2) 3.2 cm. Smaller nodules too numerous to count throughout the anterior abdomen elsewhere. Musculoskeletal: Destructive rib lesion in the LEFT chest as described. Lucent area in the LEFT inferior pubic ramus with ill-defined enhancement along the margin of the pubic bone at this level (image 121/2) 3.6 x 1.1 cm. Destructive LEFT acetabular lesion (image 109/2) 2.0 x 1.6 cm. Spinal degenerative changes.  IMPRESSION: Metastatic disease throughout the chest, abdomen and pelvis involving lungs, lymph nodes, viscera and bone as discussed. Distribution and appearance would favor pulmonary primary with metastatic disease. Bilateral adrenal and renal involvement. Infiltrative process in the bilateral kidneys could certainly be seen in the setting of pulmonary metastasis. Biopsy may be helpful given the diffuse nature of disease and the presence of bilateral infiltrative renal lesions. Comparison with prior imaging could also be helpful. Bilateral nephrolithiasis. Signs of extensive vascular disease with chronic appearing aorto iliac occlusive disease Three-vessel coronary artery disease. Aortic atherosclerosis. Aortic Atherosclerosis (ICD10-I70.0) and Emphysema (ICD10-J43.9). Electronically Signed   By: Zetta Bills M.D.   On: 07/05/2021 15:41   DG Chest Portable 1 View  Result Date: 07/05/2021 CLINICAL DATA:  Hemoptysis EXAM: PORTABLE CHEST 1 VIEW COMPARISON:  CT from earlier in the same day. FINDINGS: Cardiac shadow is within normal limits. Soft tissue density is noted in the medial aspect of the left apex consistent with the mass lesion seen on recent CT examination. Fullness in the left hilum is again noted. Scattered pulmonary  nodules are noted similar to that seen on prior CT. No acute bony abnormality is noted. Postsurgical changes in the cervical spine are noted. IMPRESSION: Changes similar to that seen on recent CT examination. No acute abnormality noted. Electronically Signed   By: Inez Catalina M.D.   On: 07/05/2021 21:24    Labs:  CBC: Recent Labs    07/05/21 1633 07/06/21 0214 07/06/21 2055 07/09/21 0243  WBC 9.6 8.9  --  9.2  HGB 13.8 13.1 13.3 12.4*  HCT 40.9 39.1 39.5 36.5*  PLT 178 167  --  142*    COAGS: Recent Labs    07/05/21 2143  INR 0.9    BMP: Recent Labs    07/05/21 1633 07/06/21 1421 07/09/21 0243  NA 131* 131* 129*  K 3.8 3.9  --   CL 96* 98  --   CO2 17* 26   --   GLUCOSE 69* 80  --   BUN 16 14  --   CALCIUM 9.6 8.9  --   CREATININE 0.52* 0.60*  --   GFRNONAA >60 >60  --     LIVER FUNCTION TESTS: Recent Labs    07/05/21 1633 07/06/21 1421  BILITOT 0.6 0.7  AST 93* 80*  ALT 64* 55*  ALKPHOS 73 69  PROT 7.1 6.6  ALBUMIN 3.4* 2.9*    Assessment and Plan: 65 year old male with admission secondary to hemoptysis and unintentional weight loss with difficulty swallowing.  CT imaging with concern for malignancy and metastatic disease. Oncology and Pulmonology has seen the patient with request for IR biopsy.   Imaging reviewed with Dr. Kathlene Cote today and will proceed with CT guided left peritoneal mass biopsy.  The patient has been NPO, no blood thinners taken, imaging, labs and vitals have been reviewed.  Risks and benefits of CT guided left peritoneal biopsy with fentanyl and lidocaine only likely given hemoptysis was discussed with the patient and/or patient's family with the use of a medical sign language interpreter including, but not limited to bleeding, infection, damage to adjacent structures or low yield requiring additional tests.  All of the questions were answered and there is agreement to proceed.  Consent signed and in chart.    Thank you for this interesting consult.  I greatly enjoyed meeting Frederick Mitchell and look forward to participating in their care.  A copy of this report was sent to the requesting provider on this date.  Electronically Signed: Hedy Jacob, PA-C 07/09/2021, 12:37 PM   I spent a total of 20 Minutes in face to face in clinical consultation, greater than 50% of which was counseling/coordinating care for left peritoneal mass.

## 2021-07-10 ENCOUNTER — Telehealth: Payer: Self-pay | Admitting: Oncology

## 2021-07-10 ENCOUNTER — Other Ambulatory Visit: Payer: Self-pay | Admitting: Nurse Practitioner

## 2021-07-10 ENCOUNTER — Ambulatory Visit: Payer: Medicare HMO

## 2021-07-10 DIAGNOSIS — C3490 Malignant neoplasm of unspecified part of unspecified bronchus or lung: Secondary | ICD-10-CM

## 2021-07-10 DIAGNOSIS — C3480 Malignant neoplasm of overlapping sites of unspecified bronchus and lung: Secondary | ICD-10-CM | POA: Diagnosis not present

## 2021-07-10 DIAGNOSIS — C799 Secondary malignant neoplasm of unspecified site: Secondary | ICD-10-CM | POA: Diagnosis not present

## 2021-07-10 DIAGNOSIS — C7951 Secondary malignant neoplasm of bone: Secondary | ICD-10-CM | POA: Diagnosis not present

## 2021-07-10 LAB — SODIUM: Sodium: 130 mmol/L — ABNORMAL LOW (ref 135–145)

## 2021-07-10 NOTE — Care Management Important Message (Signed)
Important Message  Patient Details  Name: Frederick Mitchell MRN: [id number] Date of Birth: September 11, 1955   Medicare Important Message Given:  Yes     Dannette Barbara 07/10/2021, 10:52 AM

## 2021-07-10 NOTE — Discharge Summary (Signed)
Frederick Mitchell:[id number] DOB: 1956-03-28 DOA: 07/05/2021  PCP: Gayland Curry, MD  Admit date: 07/05/2021 Discharge date: 07/10/2021  Admitted From: home Disposition:  home  Recommendations for Outpatient Follow-up:  Follow up with PCP in 1 week Please obtain BMP/CBC in one week Please follow up with pccm in one week Follow up with oncology in one week  Home Health:yes    Discharge Condition:Stable CODE STATUS:full  Diet recommendation: Heart Healthy / Carb Modified / Regular / Dysphagia  Brief/Interim Summary: Per HPI: Frederick Mitchell is a 65 y.o. male seen in ed with complaints of coughing up blood with blood streaking sputum that is been going on multiple episodes at home today.  Patient was  under investigation for suspected Lung cancer with oncologist visit on 06/15/2021 reviewed.  He was admitted for hemoptysis.  Oncology and pulmonology were consulted.   1.Hemoptysis Likely due to malignancy H&H stable  PCCM  consulted during hospitalization.        2. Left lung mass with  Metastasis Patient with extensive smoking history Oncology consulted . Pt is s/p CT-guided biopsy of the left lateral peritoneal mass by IR on 12/12 Further management plan pending tissue diagnosis      3. 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 Elevated LFT Etiology unclear. Possibly due to statin and ?mets. F/u pcp     8.hematuria None reported. H/h stable UA neg for infection .          Discharge Diagnoses:  Principal Problem:   Hemoptysis Active Problems:   COPD (chronic obstructive pulmonary disease) (HCC)   Coronary artery disease   Abnormal CT of the chest   Hematuria   Nephrolithiasis    Abnormal LFTs   Abnormal CT scan   Malignant neoplasm of overlapping sites of lung [facility])   Multiple lesions of metastatic malignancy ([place])   Bone metastases [facility])    Discharge Instructions  Discharge Instructions     Call MD for:  difficulty breathing, headache or visual disturbances   Complete by: As directed    Diet - low sodium heart healthy   Complete by: As directed    Discharge instructions   Complete by: As directed    Follow up with pulmonology , pcp , oncology within one week. Need labs   Increase activity slowly   Complete by: As directed       Allergies as of 07/10/2021   No Known Allergies      Medication List     STOP taking these medications    atorvastatin 80 MG tablet Commonly known as: LIPITOR       TAKE these medications    carboxymethylcellulose 0.5 % Soln Commonly known as: REFRESH PLUS Apply to eye.   clobetasol 0.05 % external solution Commonly known as: TEMOVATE Apply 1 application topically 2 (two) times daily.   doxepin 50 MG capsule Commonly known as: SINEQUAN Take 50 mg by mouth at bedtime.   fluticasone 50 MCG/ACT nasal spray Commonly known as: FLONASE SHAKE LIQUID AND USE 2 SPRAYS IN EACH NOSTRIL EVERY DAY   hydroxychloroquine 200 MG tablet Commonly known as: PLAQUENIL Take 200 mg by mouth 2 (two) times  daily.   hydrOXYzine 10 MG tablet Commonly known as: ATARAX Take 10 mg by mouth 3 (three) times daily as needed.   nicotine 21 mg/24hr patch Commonly known as: NICODERM CQ - dosed in mg/24 hours Place 1 patch (21 mg total) onto the skin daily for 14 days.   pantoprazole 40 MG tablet Commonly known as: Protonix Take 1 tablet (40 mg total) by mouth daily.   predniSONE 10 MG tablet Commonly known as: DELTASONE Take 10 mg by mouth daily.   Trelegy Ellipta 100-62.5-25 MCG/ACT Aepb Generic drug: Fluticasone-Umeclidin-Vilant Inhale 2 sprays into the lungs 2 (two) times daily.   triamcinolone 0.025 %  cream Commonly known as: KENALOG Apply 1 application topically 3 (three) times daily.   Vitamin D (Ergocalciferol) 1.25 MG (50000 UNIT) Caps capsule Commonly known as: DRISDOL Take 50,000 Units by mouth 3 (three) times a week.        Follow-up Information     Sindy Guadeloupe, MD Follow up in 1 week(s).   Specialty: Oncology Why: family will make Contact information: [place] [address] [phone]         Ottie Glazier, MD Follow up in 1 week(s).   Specialty: Pulmonary Disease Why: family will make Contact information: [place] [address] [phone]         Gayland Curry, MD Follow up in 1 week(s).   Specialty: Family Medicine Why: family wil make Contact information: [place] [address] [phone]                No Known Allergies  Consultations: Oncology, PCCM   Procedures/Studies: CT CHEST ABDOMEN PELVIS W CONTRAST  Result Date: 07/05/2021 CLINICAL DATA:  [name] presents for follow-up of suspected pulmonary neoplasm. EXAM: CT CHEST, ABDOMEN, AND PELVIS WITH CONTRAST TECHNIQUE: Multidetector CT imaging of the chest, abdomen and pelvis was performed following the standard protocol during bolus administration of intravenous contrast. CONTRAST:  136mL OMNIPAQUE IOHEXOL 300 MG/ML  SOLN COMPARISON:  Most recent comparison imaging of the chest are more remote chest x-rays. No direct comparison imaging is available with CT. Report from [facility] references a 1.6 x 1.00 cm pulmonary nodule in the LEFT upper lobe. FINDINGS: CT CHEST FINDINGS Cardiovascular: Calcified and noncalcified atheromatous plaque of the thoracic aorta. Normal heart size. Three-vessel coronary artery disease. Mild engorgement of central pulmonary vasculature. Mediastinum/Nodes: Diffuse adenopathy throughout the mediastinum with heterogeneous and necrotic appearance. LEFT paratracheal  mass 3.8 x 3.3 cm (image 26/2) AP window lymph node 2.3 cm short axis. RIGHT paratracheal/peribronchial lymph node (image 30/2) 16 mm short axis. Cystic lesion in the anterior mediastinum reported as benign on previous imaging, no comparison available. Pre-vascular lymph node adjacent to a LEFT upper lobe mass (image 20/2) pre-vascular lymph node at 13 mm. No thoracic inlet adenopathy. Is no axillary adenopathy. Lungs/Pleura: Numerous pulmonary nodules, too numerous to count. Largest in the LEFT upper lobe invading mediastinal fat measuring 3.6 x 3.0 cm, reportedly 1.6 x 1.0 cm on previous imaging assuming this is the same nodule. Central mass, more likely LEFT juxta hilar suprahilar adenopathy 3.0 x 3.4 cm (image 69/3) LEFT upper lobe pulmonary nodule 11 mm (image 73/3) Numerous additional pulmonary nodules on the LEFT and RIGHT. Largest in the LEFT lower lobe 2.6 x 2.3 cm. Largest in the RIGHT lower lobe (image 132/3) 2.1 cm. Airways are patent aside from LEFT upper lobe bronchials which are narrowed, narrowing of central bronchial  structures. No sign of pleural effusion. No substantial septal thickening but some subtle nodularity along the fissure in the LEFT chest is noted. Musculoskeletal: Destructive and expansile rib lesion (image 50/3) 2.5 x 2.1 cm involving the LEFT posterior fifth rib. See below for full musculoskeletal details. CT ABDOMEN PELVIS FINDINGS Hepatobiliary: Surface nodularity along the entire surface of the RIGHT hemi liver peripherally and in more since pouch subcentimeter nodules throughout this location example seen on image 74 of series 2. No parenchymal lesion with suspicious features, small cysts suspected in the LEFT hepatic lobe. Lobular hepatic contours with fissural widening. Portal vein is patent. Pancreas: Normal, without mass, inflammation or ductal dilatation. Mass adjacent to the pancreas likely within the lesser sac. Extensive serosal and peritoneal nodularity throughout the  abdomen, see below. No pancreatic ductal dilation or sign of inflammation. Spleen: Spleen normal size and contour. Adrenals/Urinary Tract: Signs of adrenal metastases bilaterally. LEFT adrenal mass (image 61/2) 3.8 x 3.6 cm. RIGHT adrenal mass (image 60/2) 2.5 cm short axis. Infiltrative parenchymal involvement of the RIGHT and LEFT kidney, on the RIGHT 6.8 x 5.0 cm and on the LEFT 6.1 cm greatest axial dimension. Surface nodularity along the renal contour, on the RIGHT, for example 3.1 cm nodule along the upper pole the RIGHT kidney. Additional areas of heterogeneity in the kidneys smaller and more subtle. No hydronephrosis. Signs of nephrolithiasis with branched calculi, largest in the lower pole on the LEFT 2.7 x 1.8 cm. This extends into the infundibulum and peripheral aspect of the renal sinus. Two smaller calculi in the lower and interpolar LEFT kidney measuring up to 1.6 cm. Smaller calculi seen on the RIGHT largest 9 mm. Stomach/Bowel: Serosal involvement of the gastric antrum (image 69/2) 2.3 cm. Smaller areas of serosal nodularity along the stomach in this location tracking towards the pancreas. No signs of bowel obstruction or acute bowel process in the setting of extensive peritoneal disease Vascular/Lymphatic: Severe vascular disease. 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 Contrast seen distal to this level in the iliac vasculature. Heavily calcified iliac vessels and plicae that this is a chronic process. Bulky retroperitoneal adenopathy (image 69/2) retrocaval lymph node 2.1 cm. LEFT para-aortic adenopathy for example (image 71/2) 1.6 cm. Intra-aortocaval adenopathy (image 85/2) 1.3 cm. Scattered smaller lymph nodes throughout the retroperitoneum. No pelvic adenopathy. Reproductive: Unremarkable by CT. Other: Extensive peritoneal disease and omental nodularity. No ascites. For example anterior to the transverse colon on image 86/2 is a 2.9 cm peritoneal nodule.  Surface nodularity in more since pouch along the under surface of the RIGHT hemi liver as described. Omental nodule in the LEFT upper quadrant (image 75/2) 3.2 cm. Smaller nodules too numerous to count throughout the anterior abdomen elsewhere. Musculoskeletal: Destructive rib lesion in the LEFT chest as described. Lucent area in the LEFT inferior pubic ramus with ill-defined enhancement along the margin of the pubic bone at this level (image 121/2) 3.6 x 1.1 cm. Destructive LEFT acetabular lesion (image 109/2) 2.0 x 1.6 cm. Spinal degenerative changes. IMPRESSION: Metastatic disease throughout the chest, abdomen and pelvis involving lungs, lymph nodes, viscera and bone as discussed. Distribution and appearance would favor pulmonary primary with metastatic disease. Bilateral adrenal and renal involvement. Infiltrative process in the bilateral kidneys could certainly be seen in the setting of pulmonary metastasis. Biopsy may be helpful given the diffuse nature of disease and the presence of bilateral infiltrative renal lesions. Comparison with prior imaging could also be helpful. Bilateral nephrolithiasis. Signs of extensive  vascular disease with chronic appearing aorto iliac occlusive disease Three-vessel coronary artery disease. Aortic atherosclerosis. Aortic Atherosclerosis (ICD10-I70.0) and Emphysema (ICD10-J43.9). Electronically Signed   By: Zetta Bills M.D.   On: 07/05/2021 15:41   CT BIOPSY  Result Date: 07/09/2021 CLINICAL DATA:  Dominant left upper lobe/perihilar lung mass with multiple bilateral lung nodules, mediastinal lymphadenopathy, adrenal masses, renal masses and peritoneal masses. The patient presents for peritoneal biopsy to try to establish a tissue diagnosis. EXAM: CT GUIDED CORE BIOPSY OF PERITONEAL ABDOMINAL MASS ANESTHESIA/SEDATION: Formal conscious sedation was not administered as the patient is on oxygen and currently has hemoptysis. He was given 0.5 mg of IV Versed just prior to  beginning the procedure for anxiolysis. PROCEDURE: The procedure risks, benefits, and alternatives were explained to the patient. Questions regarding the procedure were encouraged and answered. The patient understands and consents to the procedure. A time-out was performed prior to initiating the procedure. CT was performed through the abdomen in a supine position. The left lateral abdominal wall was prepped with chlorhexidine in a sterile fashion, and a sterile drape was applied covering the operative field. A sterile gown and sterile gloves were used for the procedure. Local anesthesia was provided with 1% Lidocaine. Under CT guidance, a 28 gauge trocar needle was advanced into the lateral left peritoneal cavity lateral to small bowel and anterior to the descending colon. Coaxial 18 gauge core biopsy samples were then obtained of peritoneal masses. A total of 3 core biopsy samples were obtained and submitted in formalin. Gel-Foam pledgets were advanced through the outer needle prior to needle retraction and removal. Additional CT was performed. COMPLICATIONS: None FINDINGS: Multiple nodular peritoneal masses are again seen clustered in the lateral left peritoneal cavity at the level of the mid kidneys. Solid tissue samples were obtained. IMPRESSION: CT-guided core biopsy performed of left lateral peritoneal tumor. Electronically Signed   By: Aletta Edouard M.D.   On: 07/09/2021 14:44   DG Chest Portable 1 View  Result Date: 07/05/2021 CLINICAL DATA:  Hemoptysis EXAM: PORTABLE CHEST 1 VIEW COMPARISON:  CT from earlier in the same day. FINDINGS: Cardiac shadow is within normal limits. Soft tissue density is noted in the medial aspect of the left apex consistent with the mass lesion seen on recent CT examination. Fullness in the left hilum is again noted. Scattered pulmonary nodules are noted similar to that seen on prior CT. No acute bony abnormality is noted. Postsurgical changes in the cervical spine are  noted. IMPRESSION: Changes similar to that seen on recent CT examination. No acute abnormality noted. Electronically Signed   By: Inez Catalina M.D.   On: 07/05/2021 21:24      Subjective: No shortness of breath, dizziness, chest pain.  Family at bedside  Discharge Exam: Vitals:   07/10/21 0759 07/10/21 1130  BP: (!) 108/56   Pulse: 84 (!) 105  Resp: 20   Temp: 98 F (36.7 C)   SpO2: 92% 100%   Vitals:   07/09/21 1540 07/09/21 2146 07/10/21 0759 07/10/21 1130  BP: 119/65 105/62 (!) 108/56   Pulse: 92 100 84 (!) 105  Resp: 18 (!) 22 20   Temp: 97.8 F (36.6 C) 98.2 F (36.8 C) 98 F (36.7 C)   TempSrc: Oral Oral    SpO2: 93% 94% 92% 100%  Weight:      Height:        General: Pt is alert, awake, not in acute distress Cardiovascular: RRR, S1/S2 +, no rubs, no gallops Respiratory:  CTA bilaterally, no wheezing, no rhonchi Abdominal: Soft, NT, ND, bowel sounds + Extremities: no edema, no cyanosis    The results of significant diagnostics from this hospitalization (including imaging, microbiology, ancillary and laboratory) are listed below for reference.     Microbiology: Recent Results (from the past 240 hour(s))  Resp Panel by RT-PCR (Flu A&B, Covid) Nasopharyngeal Swab     Status: None   Collection Time: 07/05/21  9:09 PM   Specimen: Nasopharyngeal Swab; Nasopharyngeal(NP) swabs in vial transport medium  Result Value Ref Range Status   SARS Coronavirus 2 by RT PCR NEGATIVE NEGATIVE Final    Comment: (NOTE) SARS-CoV-2 target nucleic acids are NOT DETECTED.  The SARS-CoV-2 RNA is generally detectable in upper respiratory specimens during the acute phase of infection. The lowest concentration of SARS-CoV-2 viral copies this assay can detect is 138 copies/mL. A negative result does not preclude SARS-Cov-2 infection and should not be used as the sole basis for treatment or other patient management decisions. A negative result may occur with  improper specimen  collection/handling, submission of specimen other than nasopharyngeal swab, presence of viral mutation(s) within the areas targeted by this assay, and inadequate number of viral copies(<138 copies/mL). A negative result must be combined with clinical observations, patient history, and epidemiological information. The expected result is Negative.  Fact Sheet for Patients:  EntrepreneurPulse.com.au  Fact Sheet for Healthcare Providers:  IncredibleEmployment.be  This test is no t yet approved or cleared by the [place] FDA and  has been authorized for detection and/or diagnosis of SARS-CoV-2 by FDA under an Emergency Use Authorization (EUA). This EUA will remain  in effect (meaning this test can be used) for the duration of the COVID-19 declaration under Section 564(b)(1) of the Act, 21 U.S.C.section 360bbb-3(b)(1), unless the authorization is terminated  or revoked sooner.       Influenza A by PCR NEGATIVE NEGATIVE Final   Influenza B by PCR NEGATIVE NEGATIVE Final    Comment: (NOTE) The Xpert Xpress SARS-CoV-2/FLU/RSV plus assay is intended as an aid in the diagnosis of influenza from Nasopharyngeal swab specimens and should not be used as a sole basis for treatment. Nasal washings and aspirates are unacceptable for Xpert Xpress SARS-CoV-2/FLU/RSV testing.  Fact Sheet for Patients: EntrepreneurPulse.com.au  Fact Sheet for Healthcare Providers: IncredibleEmployment.be  This test is not yet approved or cleared by the [place] FDA and has been authorized for detection and/or diagnosis of SARS-CoV-2 by FDA under an Emergency Use Authorization (EUA). This EUA will remain in effect (meaning this test can be used) for the duration of the COVID-19 declaration under Section 564(b)(1) of the Act, 21 U.S.C. section 360bbb-3(b)(1), unless the authorization is terminated or revoked.  Performed at [facility], [address] [place], [place] [address]      Labs: BNP (last 3 results) No results for input(s): BNP in the last 8760 hours. Basic Metabolic Panel: Recent Labs  Lab 07/05/21 1633 07/06/21 1421 07/09/21 0243 07/10/21 0910  NA 131* 131* 129* 130*  K 3.8 3.9  --   --   CL 96* 98  --   --   CO2 17* 26  --   --   GLUCOSE 69* 80  --   --   BUN 16 14  --   --   CREATININE 0.52* 0.60*  --   --   CALCIUM 9.6 8.9  --   --    Liver Function Tests: Recent Labs  Lab 07/05/21 1633 07/06/21  1421  AST 93* 80*  ALT 64* 55*  ALKPHOS 73 69  BILITOT 0.6 0.7  PROT 7.1 6.6  ALBUMIN 3.4* 2.9*   Recent Labs  Lab 07/05/21 1633  LIPASE 45   No results for input(s): AMMONIA in the last 168 hours. CBC: Recent Labs  Lab 07/05/21 1633 07/06/21 0214 07/06/21 2055 07/09/21 0243  WBC 9.6 8.9  --  9.2  HGB 13.8 13.1 13.3 12.4*  HCT 40.9 39.1 39.5 36.5*  MCV 92.5 93.1  --  91.7  PLT 178 167  --  142*   Cardiac Enzymes: No results for input(s): CKTOTAL, CKMB, CKMBINDEX, TROPONINI in the last 168 hours. BNP: Invalid input(s): POCBNP CBG: No results for input(s): GLUCAP in the last 168 hours. D-Dimer No results for input(s): DDIMER in the last 72 hours. Hgb A1c No results for input(s): HGBA1C in the last 72 hours. Lipid Profile No results for input(s): CHOL, HDL, LDLCALC, TRIG, CHOLHDL, LDLDIRECT in the last 72 hours. Thyroid function studies No results for input(s): TSH, T4TOTAL, T3FREE, THYROIDAB in the last 72 hours.  Invalid input(s): FREET3 Anemia work up No results for input(s): VITAMINB12, FOLATE, FERRITIN, TIBC, IRON, RETICCTPCT in the last 72 hours. Urinalysis    Component Value Date/Time   COLORURINE YELLOW 07/05/2021 1633   APPEARANCEUR CLEAR 07/05/2021 1633   APPEARANCEUR Hazy 09/01/2012 1230   LABSPEC 1.010 07/05/2021 1633   LABSPEC 1.015 09/01/2012 1230   PHURINE 6.5 07/05/2021 1633   GLUCOSEU NEGATIVE 07/05/2021 1633   GLUCOSEU Negative  09/01/2012 1230   HGBUR MODERATE (A) 07/05/2021 1633   BILIRUBINUR NEGATIVE 07/05/2021 1633   BILIRUBINUR Negative 09/01/2012 1230   KETONESUR NEGATIVE 07/05/2021 1633   PROTEINUR NEGATIVE 07/05/2021 1633   NITRITE NEGATIVE 07/05/2021 1633   LEUKOCYTESUR NEGATIVE 07/05/2021 1633   LEUKOCYTESUR 1+ 09/01/2012 1230   Sepsis Labs Invalid input(s): PROCALCITONIN,  WBC,  LACTICIDVEN Microbiology Recent Results (from the past 240 hour(s))  Resp Panel by RT-PCR (Flu A&B, Covid) Nasopharyngeal Swab     Status: None   Collection Time: 07/05/21  9:09 PM   Specimen: Nasopharyngeal Swab; Nasopharyngeal(NP) swabs in vial transport medium  Result Value Ref Range Status   SARS Coronavirus 2 by RT PCR NEGATIVE NEGATIVE Final    Comment: (NOTE) SARS-CoV-2 target nucleic acids are NOT DETECTED.  The SARS-CoV-2 RNA is generally detectable in upper respiratory specimens during the acute phase of infection. The lowest concentration of SARS-CoV-2 viral copies this assay can detect is 138 copies/mL. A negative result does not preclude SARS-Cov-2 infection and should not be used as the sole basis for treatment or other patient management decisions. A negative result may occur with  improper specimen collection/handling, submission of specimen other than nasopharyngeal swab, presence of viral mutation(s) within the areas targeted by this assay, and inadequate number of viral copies(<138 copies/mL). A negative result must be combined with clinical observations, patient history, and epidemiological information. The expected result is Negative.  Fact Sheet for Patients:  EntrepreneurPulse.com.au  Fact Sheet for Healthcare Providers:  IncredibleEmployment.be  This test is no t yet approved or cleared by the [place] FDA and  has been authorized for detection and/or diagnosis of SARS-CoV-2 by FDA under an Emergency Use Authorization (EUA). This EUA will remain  in  effect (meaning this test can be used) for the duration of the COVID-19 declaration under Section 564(b)(1) of the Act, 21 U.S.C.section 360bbb-3(b)(1), unless the authorization is terminated  or revoked sooner.       Influenza A  by PCR NEGATIVE NEGATIVE Final   Influenza B by PCR NEGATIVE NEGATIVE Final    Comment: (NOTE) The Xpert Xpress SARS-CoV-2/FLU/RSV plus assay is intended as an aid in the diagnosis of influenza from Nasopharyngeal swab specimens and should not be used as a sole basis for treatment. Nasal washings and aspirates are unacceptable for Xpert Xpress SARS-CoV-2/FLU/RSV testing.  Fact Sheet for Patients: EntrepreneurPulse.com.au  Fact Sheet for Healthcare Providers: IncredibleEmployment.be  This test is not yet approved or cleared by the [place] FDA and has been authorized for detection and/or diagnosis of SARS-CoV-2 by FDA under an Emergency Use Authorization (EUA). This EUA will remain in effect (meaning this test can be used) for the duration of the COVID-19 declaration under Section 564(b)(1) of the Act, 21 U.S.C. section 360bbb-3(b)(1), unless the authorization is terminated or revoked.  Performed at [facility], [address] [place], [place] [address]      Time coordinating discharge: Over 30 minutes  SIGNED:   Nolberto Hanlon, MD  [facility] 07/10/2021, 2:14 PM Pager   If 7PM-7AM, please contact night-coverage [email or web address] Password [email or web address]

## 2021-07-10 NOTE — Progress Notes (Signed)
Hematology/Oncology Progress Note [phone] [phone]  Frederick Mitchell   DOB:01-15-1956   XB#:[id number]   ZHY#:[id number]  History of Presenting Illness: Patient is 65 year old male who was seen by Dr. Posey Pronto from rheumatology for symptoms of ongoing skin rash which was biopsied and thought to be consistent with lupus.  He is currently on Plaquenil for same.  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  He underwent CT chest as part of the lung cancer screening program in April 2021 which had shown a left upper lobe 1.6 x 1 cm lung nodule and a 6 mm soft tissue nodule.  PET scan was supposed to be done thereafter but patient never showed for that.  Now given there is a concern for dermatomyositis which can be associated with malignancy, patient has been referred to oncology.  He has a 30 pound weight loss in the past year.  He has significant smoking history and continues to smoke one half a pack of cigarettes per day. He underwent CT C/A/P on 07/05/21 which showed widespread metastatic disease with suspected lung primary. Later that day he presented to ER with complaints of hematemesis.  On 07/09/21 he underwent omental biopsy with IR. Pathology is pending.    Subjective: Patient remains hospitalized. Requesting treatment plan and biopsy results. Continues to feel well and denies complains. Family including spouse and niece are at bedside.    Objective:  Vitals:   07/10/21 0759 07/10/21 1130  BP: (!) 108/56   Pulse: 84 (!) 105  Resp: 20   Temp: 98 F (36.7 C)   SpO2: 92% 100%    Body mass index is 25.37 kg/m.  Intake/Output Summary (Last 24 hours) at 07/10/2021 1228 Last data filed at 07/10/2021 1042 Gross per 24 hour  Intake 720 ml  Output 2 ml  Net 718 ml   General: Well-developed, well-nourished, no acute distress. Ambulates w/o aids.  HEENT: Deaf.  Lungs:  No audible wheezing or coughing. Room air.  Heart: Regular rate and rhythm.  Abdomen: Soft, nontender, nondistended.  Musculoskeletal: No edema, cyanosis, or clubbing. Neuro: Alert, answering all questions appropriately. Cranial nerves grossly intact. Skin: rash as previously noted Psych: Normal affect.   Labs:  Lab Results  Component Value Date   WBC 9.2 07/09/2021   HGB 12.4 (L) 07/09/2021   HCT 36.5 (L) 07/09/2021   MCV 91.7 07/09/2021   PLT 142 (L) 07/09/2021   NEUTROABS 17.4 (H) 29/52/8413   Basic Metabolic Panel: Recent Labs  Lab 07/05/21 1633 07/06/21 1421 07/09/21 0243 07/10/21 0910  NA 131* 131* 129* 130*  K 3.8 3.9  --   --   CL 96* 98  --   --   CO2 17* 26  --   --   GLUCOSE 69* 80  --   --   BUN 16 14  --   --   CREATININE 0.52* 0.60*  --   --   CALCIUM 9.6 8.9  --   --     Studies:  CT BIOPSY  Result Date: 07/09/2021 CLINICAL DATA:  Dominant left upper lobe/perihilar lung mass with multiple bilateral lung nodules, mediastinal lymphadenopathy, adrenal masses, renal masses and peritoneal masses. The patient presents for peritoneal biopsy to try to establish a tissue diagnosis. EXAM: CT GUIDED CORE BIOPSY OF PERITONEAL ABDOMINAL MASS ANESTHESIA/SEDATION: Formal conscious sedation was not administered as the patient is on oxygen and currently has  hemoptysis. He was given 0.5 mg of IV Versed just prior to beginning the procedure for anxiolysis. PROCEDURE: The procedure risks, benefits, and alternatives were explained to the patient. Questions regarding the procedure were encouraged and answered. The patient understands and consents to the procedure. A time-out was performed prior to initiating the procedure. CT was performed through the abdomen in a supine position. The left lateral abdominal wall was prepped with chlorhexidine in a sterile fashion, and a sterile drape was applied covering the operative field. A sterile gown and sterile gloves were used for the  procedure. Local anesthesia was provided with 1% Lidocaine. Under CT guidance, a 85 gauge trocar needle was advanced into the lateral left peritoneal cavity lateral to small bowel and anterior to the descending colon. Coaxial 18 gauge core biopsy samples were then obtained of peritoneal masses. A total of 3 core biopsy samples were obtained and submitted in formalin. Gel-Foam pledgets were advanced through the outer needle prior to needle retraction and removal. Additional CT was performed. COMPLICATIONS: None FINDINGS: Multiple nodular peritoneal masses are again seen clustered in the lateral left peritoneal cavity at the level of the mid kidneys. Solid tissue samples were obtained. IMPRESSION: CT-guided core biopsy performed of left lateral peritoneal tumor. Electronically Signed   By: Aletta Edouard M.D.   On: 07/09/2021 14:44    Assessment & Plan: 65 y.o. currently admitted for hemoptysis, now resolved, and found to have wide spread metastatic disease.  Left lung mass with widespread metastatic lesions- suspect NSCLC. Again reviewed with patient that biopsy including preliminary result is not yet available. While clinically, this appears to be lung cancer, need biopsy to confirm that and develop treatment plan and present options to him. Reviewed that this will be done outpatient. I plan to have an MRI of his brain ordered outpatient and to be done prior to his visit with medical oncology. Reviewed that we would call him for appointments. Patient requests that his niece, Doristine Counter be point of contact for appointments. She is a Psychologist, sport and exercise and is able to help get patient to and from his appointments. Patient is agreeable to discharge. Will plan to see him back after the Christmas holiday and he can follow up with Dr. Janese Banks at that time.    Verlon Au, NP 07/10/2021   Medical Oncology and Hematology [place] [facility] [place]: Dr. Janese Banks

## 2021-07-10 NOTE — Progress Notes (Signed)
PULMONOLOGY         Date: 07/10/2021,   MRN# [id number] Frederick Mitchell 12-30-1955     AdmissionWeight: 73.5 kg                 CurrentWeight: 73.5 kg   Referring physician: Dr Kurtis Bushman   CHIEF COMPLAINT:   Non-Massive hemoptysis   HISTORY OF PRESENT ILLNESS   This is a [name] with hx of CAD, COPD, congenital left hip dysplasia, nephrolithiasis, hearing impariment, Lifelong smoking, metabolic syndrome.  He had CT chest with left lung mass noted and hemoptysis. He was seen by oncology and via help from sign language interpreter was able to report constitutional symptoms including weight loss unintentionally, severe fatigue, and has exensive history of cancer in immediate family. PCCM consultation for further evaluation and management with lung mass and hemoptysis. CT abd was reviewed by me with multiple metastatic lung lesions noted bilaterally in lungs with dominant left upper primary.There is also distant mets per report.   07/10/21- patient s/p lung biopsy and wishes to go home , we are availabe in outpatient pulmonology to continue careplan with metastatic cancer.   PAST MEDICAL HISTORY   Past Medical History:  Diagnosis Date   Deaf    Hypercholesteremia unk     SURGICAL HISTORY   Past Surgical History:  Procedure Laterality Date   EYE SURGERY       FAMILY HISTORY   Family History  Problem Relation Age of Onset   Cancer Father    Cancer Sister    Cancer Maternal Uncle    Cancer Paternal Aunt    Cancer Maternal Grandmother    Cancer Maternal Grandfather      SOCIAL HISTORY   Social History   Tobacco Use   Smoking status: Every Day    Types: Cigarettes   Smokeless tobacco: Never  Substance Use Topics   Alcohol use: No   Drug use: Not Currently     MEDICATIONS    Home Medication:    Current Medication:  Current Facility-Administered Medications:    0.9 %  sodium chloride infusion, 250 mL, Intravenous, PRN, Para Skeans, MD    doxepin (SINEQUAN) capsule 50 mg, 50 mg, Oral, QHS, Florina Ou V, MD, 50 mg at 07/09/21 2054   feeding supplement (ENSURE ENLIVE / ENSURE PLUS) liquid 237 mL, 237 mL, Oral, TID BM, Amery, Sahar, MD, 237 mL at 07/10/21 0809   fluticasone (FLONASE) 50 MCG/ACT nasal spray 1 spray, 1 spray, Each Nare, Daily PRN, Ottie Glazier, MD   hydroxychloroquine (PLAQUENIL) tablet 200 mg, 200 mg, Oral, BID, Florina Ou V, MD, 200 mg at 07/10/21 0804   hydrOXYzine (ATARAX) tablet 10 mg, 10 mg, Oral, TID PRN, Para Skeans, MD   midazolam (VERSED) injection, , Intravenous, PRN, Aletta Edouard, MD, 0.5 mg at 07/09/21 1323   multivitamin with minerals tablet 1 tablet, 1 tablet, Oral, Daily, Kurtis Bushman, Sahar, MD, 1 tablet at 07/10/21 0804   nicotine (NICODERM CQ - dosed in mg/24 hours) patch 21 mg, 21 mg, Transdermal, Daily, Posey Pronto, Ekta V, MD   pantoprazole (PROTONIX) injection 40 mg, 40 mg, Intravenous, Q12H, Florina Ou V, MD, 40 mg at 07/09/21 0902   phenol (CHLORASEPTIC) mouth spray 1 spray, 1 spray, Mouth/Throat, PRN, Nolberto Hanlon, MD, 1 spray at 07/07/21 1148   predniSONE (DELTASONE) tablet 10 mg, 10 mg, Oral, Q breakfast, Dallie Piles, RPH, 10 mg at 07/10/21 0805   sodium chloride flush (NS) 0.9 % injection  3 mL, 3 mL, Intravenous, Q12H, Florina Ou V, MD, 3 mL at 07/09/21 0904   sodium chloride flush (NS) 0.9 % injection 3 mL, 3 mL, Intravenous, PRN, Para Skeans, MD    ALLERGIES   Patient has no known allergies.     REVIEW OF SYSTEMS    Review of Systems:  Gen:  Denies  fever, sweats, chills weigh loss  HEENT: Denies blurred vision, double vision, ear pain, eye pain, hearing loss, nose bleeds, sore throat Cardiac:  No dizziness, chest pain or heaviness, chest tightness,edema Resp:   Denies cough or sputum porduction, shortness of breath,wheezing, hemoptysis,  Gi: Denies swallowing difficulty, stomach pain, nausea or vomiting, diarrhea, constipation, bowel incontinence Gu:  Denies bladder  incontinence, burning urine Ext:   Denies Joint pain, stiffness or swelling Skin: Denies  skin rash, easy bruising or bleeding or hives Endoc:  Denies polyuria, polydipsia , polyphagia or weight change Psych:   Denies depression, insomnia or hallucinations   Other:  All other systems negative   VS: BP (!) 108/56 (BP Location: Left Arm)    Pulse (!) 105    Temp 98 F (36.7 C)    Resp 20    Ht 5\' 7"  (1.702 m)    Wt 73.5 kg    SpO2 100%    BMI 25.37 kg/m      PHYSICAL EXAM    GENERAL:NAD, no fevers, chills, no weakness no fatigue HEAD: Normocephalic, atraumatic.  EYES: Pupils equal, round, reactive to light. Extraocular muscles intact. No scleral icterus.  MOUTH: Moist mucosal membrane. Dentition intact. No abscess noted.  EAR, NOSE, THROAT: Clear without exudates. No external lesions.  NECK: Supple. No thyromegaly. No nodules. No JVD.  PULMONARY: Diffuse coarse rhonchi right sided +wheezes CARDIOVASCULAR: S1 and S2. Regular rate and rhythm. No murmurs, rubs, or gallops. No edema. Pedal pulses 2+ bilaterally.  GASTROINTESTINAL: Soft, nontender, nondistended. No masses. Positive bowel sounds. No hepatosplenomegaly.  MUSCULOSKELETAL: No swelling, clubbing, or edema. Range of motion full in all extremities.  NEUROLOGIC: Cranial nerves II through XII are intact. No gross focal neurological deficits. Sensation intact. Reflexes intact.  SKIN: No ulceration, lesions, rashes, or cyanosis. Skin warm and dry. Turgor intact.  PSYCHIATRIC: Mood, affect within normal limits. The patient is awake, alert and oriented x 3. Insight, judgment intact.       IMAGING    CT CHEST ABDOMEN PELVIS W CONTRAST  Result Date: 07/05/2021 CLINICAL DATA:  [name] presents for follow-up of suspected pulmonary neoplasm. EXAM: CT CHEST, ABDOMEN, AND PELVIS WITH CONTRAST TECHNIQUE: Multidetector CT imaging of the chest, abdomen and pelvis was performed following the standard protocol during bolus  administration of intravenous contrast. CONTRAST:  123mL OMNIPAQUE IOHEXOL 300 MG/ML  SOLN COMPARISON:  Most recent comparison imaging of the chest are more remote chest x-rays. No direct comparison imaging is available with CT. Report from [place] [facility] references a 1.6 x 1.00 cm pulmonary nodule in the LEFT upper lobe. FINDINGS: CT CHEST FINDINGS Cardiovascular: Calcified and noncalcified atheromatous plaque of the thoracic aorta. Normal heart size. Three-vessel coronary artery disease. Mild engorgement of central pulmonary vasculature. Mediastinum/Nodes: Diffuse adenopathy throughout the mediastinum with heterogeneous and necrotic appearance. LEFT paratracheal mass 3.8 x 3.3 cm (image 26/2) AP window lymph node 2.3 cm short axis. RIGHT paratracheal/peribronchial lymph node (image 30/2) 16 mm short axis. Cystic lesion in the anterior mediastinum reported as benign on previous imaging, no comparison available. Pre-vascular lymph node adjacent to a LEFT upper lobe  mass (image 20/2) pre-vascular lymph node at 13 mm. No thoracic inlet adenopathy. Is no axillary adenopathy. Lungs/Pleura: Numerous pulmonary nodules, too numerous to count. Largest in the LEFT upper lobe invading mediastinal fat measuring 3.6 x 3.0 cm, reportedly 1.6 x 1.0 cm on previous imaging assuming this is the same nodule. Central mass, more likely LEFT juxta hilar suprahilar adenopathy 3.0 x 3.4 cm (image 69/3) LEFT upper lobe pulmonary nodule 11 mm (image 73/3) Numerous additional pulmonary nodules on the LEFT and RIGHT. Largest in the LEFT lower lobe 2.6 x 2.3 cm. Largest in the RIGHT lower lobe (image 132/3) 2.1 cm. Airways are patent aside from LEFT upper lobe bronchials which are narrowed, narrowing of central bronchial structures. No sign of pleural effusion. No substantial septal thickening but some subtle nodularity along the fissure in the LEFT chest is noted. Musculoskeletal: Destructive and expansile rib lesion (image  50/3) 2.5 x 2.1 cm involving the LEFT posterior fifth rib. See below for full musculoskeletal details. CT ABDOMEN PELVIS FINDINGS Hepatobiliary: Surface nodularity along the entire surface of the RIGHT hemi liver peripherally and in more since pouch subcentimeter nodules throughout this location example seen on image 74 of series 2. No parenchymal lesion with suspicious features, small cysts suspected in the LEFT hepatic lobe. Lobular hepatic contours with fissural widening. Portal vein is patent. Pancreas: Normal, without mass, inflammation or ductal dilatation. Mass adjacent to the pancreas likely within the lesser sac. Extensive serosal and peritoneal nodularity throughout the abdomen, see below. No pancreatic ductal dilation or sign of inflammation. Spleen: Spleen normal size and contour. Adrenals/Urinary Tract: Signs of adrenal metastases bilaterally. LEFT adrenal mass (image 61/2) 3.8 x 3.6 cm. RIGHT adrenal mass (image 60/2) 2.5 cm short axis. Infiltrative parenchymal involvement of the RIGHT and LEFT kidney, on the RIGHT 6.8 x 5.0 cm and on the LEFT 6.1 cm greatest axial dimension. Surface nodularity along the renal contour, on the RIGHT, for example 3.1 cm nodule along the upper pole the RIGHT kidney. Additional areas of heterogeneity in the kidneys smaller and more subtle. No hydronephrosis. Signs of nephrolithiasis with branched calculi, largest in the lower pole on the LEFT 2.7 x 1.8 cm. This extends into the infundibulum and peripheral aspect of the renal sinus. Two smaller calculi in the lower and interpolar LEFT kidney measuring up to 1.6 cm. Smaller calculi seen on the RIGHT largest 9 mm. Stomach/Bowel: Serosal involvement of the gastric antrum (image 69/2) 2.3 cm. Smaller areas of serosal nodularity along the stomach in this location tracking towards the pancreas. No signs of bowel obstruction or acute bowel process in the setting of extensive peritoneal disease Vascular/Lymphatic: Severe vascular  disease. 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 Contrast seen distal to this level in the iliac vasculature. Heavily calcified iliac vessels and plicae that this is a chronic process. Bulky retroperitoneal adenopathy (image 69/2) retrocaval lymph node 2.1 cm. LEFT para-aortic adenopathy for example (image 71/2) 1.6 cm. Intra-aortocaval adenopathy (image 85/2) 1.3 cm. Scattered smaller lymph nodes throughout the retroperitoneum. No pelvic adenopathy. Reproductive: Unremarkable by CT. Other: Extensive peritoneal disease and omental nodularity. No ascites. For example anterior to the transverse colon on image 86/2 is a 2.9 cm peritoneal nodule. Surface nodularity in more since pouch along the under surface of the RIGHT hemi liver as described. Omental nodule in the LEFT upper quadrant (image 75/2) 3.2 cm. Smaller nodules too numerous to count throughout the anterior abdomen elsewhere. Musculoskeletal: Destructive rib lesion in the LEFT chest  as described. Lucent area in the LEFT inferior pubic ramus with ill-defined enhancement along the margin of the pubic bone at this level (image 121/2) 3.6 x 1.1 cm. Destructive LEFT acetabular lesion (image 109/2) 2.0 x 1.6 cm. Spinal degenerative changes. IMPRESSION: Metastatic disease throughout the chest, abdomen and pelvis involving lungs, lymph nodes, viscera and bone as discussed. Distribution and appearance would favor pulmonary primary with metastatic disease. Bilateral adrenal and renal involvement. Infiltrative process in the bilateral kidneys could certainly be seen in the setting of pulmonary metastasis. Biopsy may be helpful given the diffuse nature of disease and the presence of bilateral infiltrative renal lesions. Comparison with prior imaging could also be helpful. Bilateral nephrolithiasis. Signs of extensive vascular disease with chronic appearing aorto iliac occlusive disease Three-vessel coronary artery disease. Aortic  atherosclerosis. Aortic Atherosclerosis (ICD10-I70.0) and Emphysema (ICD10-J43.9). Electronically Signed   By: Zetta Bills M.D.   On: 07/05/2021 15:41   CT BIOPSY  Result Date: 07/09/2021 CLINICAL DATA:  Dominant left upper lobe/perihilar lung mass with multiple bilateral lung nodules, mediastinal lymphadenopathy, adrenal masses, renal masses and peritoneal masses. The patient presents for peritoneal biopsy to try to establish a tissue diagnosis. EXAM: CT GUIDED CORE BIOPSY OF PERITONEAL ABDOMINAL MASS ANESTHESIA/SEDATION: Formal conscious sedation was not administered as the patient is on oxygen and currently has hemoptysis. He was given 0.5 mg of IV Versed just prior to beginning the procedure for anxiolysis. PROCEDURE: The procedure risks, benefits, and alternatives were explained to the patient. Questions regarding the procedure were encouraged and answered. The patient understands and consents to the procedure. A time-out was performed prior to initiating the procedure. CT was performed through the abdomen in a supine position. The left lateral abdominal wall was prepped with chlorhexidine in a sterile fashion, and a sterile drape was applied covering the operative field. A sterile gown and sterile gloves were used for the procedure. Local anesthesia was provided with 1% Lidocaine. Under CT guidance, a 9 gauge trocar needle was advanced into the lateral left peritoneal cavity lateral to small bowel and anterior to the descending colon. Coaxial 18 gauge core biopsy samples were then obtained of peritoneal masses. A total of 3 core biopsy samples were obtained and submitted in formalin. Gel-Foam pledgets were advanced through the outer needle prior to needle retraction and removal. Additional CT was performed. COMPLICATIONS: None FINDINGS: Multiple nodular peritoneal masses are again seen clustered in the lateral left peritoneal cavity at the level of the mid kidneys. Solid tissue samples were obtained.  IMPRESSION: CT-guided core biopsy performed of left lateral peritoneal tumor. Electronically Signed   By: Aletta Edouard M.D.   On: 07/09/2021 14:44   DG Chest Portable 1 View  Result Date: 07/05/2021 CLINICAL DATA:  Hemoptysis EXAM: PORTABLE CHEST 1 VIEW COMPARISON:  CT from earlier in the same day. FINDINGS: Cardiac shadow is within normal limits. Soft tissue density is noted in the medial aspect of the left apex consistent with the mass lesion seen on recent CT examination. Fullness in the left hilum is again noted. Scattered pulmonary nodules are noted similar to that seen on prior CT. No acute bony abnormality is noted. Postsurgical changes in the cervical spine are noted. IMPRESSION: Changes similar to that seen on recent CT examination. No acute abnormality noted. Electronically Signed   By: Inez Catalina M.D.   On: 07/05/2021 21:24      ASSESSMENT/PLAN   Left lung mass with distant metastatic lesions - agree with oncology evaluation - likely metastatic lung  cancer - IR biopsy ordered -If IR unable to perform biopsy we will be able to do bronchoscopy  -I spoke to sister of patient Frederick Mitchell) and patient with wife.  He wants to go home but his wife and sister want him to stay for biopsy.  I will call palliative and he can discuss what he wants to do. I recommend at least getting an answer as to what this is prior.    Non-massive hemoptysis  -patient is on flonase which often causes epistaxis and we can dc this for now -currently with no ongoing hemptysis will continue to monitor for recurrence -no anticoagulation on MAR or antiplatelet agents  -will consider nebulized traxeamic acid if necessary    Advanced Centrilobular emphysema with COPD -Agree with prednisone and nebulizer therapy    Thank you for allowing me to participate in the care of this patient.  Total face to face encounter time for this patient visit was >77min. >50% of the time was  spent in counseling and coordination of  care.   Patient/Family are satisfied with care plan and all questions have been answered.  This document was prepared using Dragon voice recognition software and may include unintentional dictation errors.     Ottie Glazier, M.D.  Division of [facility]

## 2021-07-10 NOTE — TOC Initial Note (Signed)
Transition of Care [facility]) - Initial/Assessment Note    Patient Details  Name: Frederick Mitchell MRN: [id number] Date of Birth: 12-09-1955  Transition of Care [facility]) CM/SW Contact:    Beverly Sessions, RN Phone Number: 07/10/2021, 12:36 PM  Clinical Narrative:                  Admitted OMV:[id number] Admitted from:Home with wife GEZ:[id number]  Current home health/prior home health/DME: RW  Met with patient, wife, and sister at bedside.  Using ASL interpreter on IPAD Patient requesting home health services. States he does not have a preference of home health agency. Referral made to Sarah with [facility]  Baseline patient drives himself to appointments.  Provided [facility] resources, and patient to follow up with cancer center SW should transportation issues arise     Expected Discharge Plan: [place] Barriers to Discharge: Barriers Resolved   Patient Goals and CMS Choice        Expected Discharge Plan and Services Expected Discharge Plan: [place]       Living arrangements for the past 2 months: Mobile Home Expected Discharge Date: 07/09/21                                    Prior Living Arrangements/Services Living arrangements for the past 2 months: Mobile Home Lives with:: Spouse Patient language and need for interpreter reviewed:: Yes Do you feel safe going back to the place where you live?: Yes      Need for Family Participation in Patient Care: Yes (Comment) Care giver support system in place?: Yes (comment)   Criminal Activity/Legal Involvement Pertinent to Current Situation/Hospitalization: No - Comment as needed  Activities of Daily Living Home Assistive Devices/Equipment: Environmental consultant (specify type), Shower chair with back ADL Screening (condition at time of admission) Patient's cognitive ability adequate to safely complete daily activities?: No Is the patient deaf or have difficulty hearing?: Yes Does the patient have  difficulty seeing, even when wearing glasses/contacts?: No Does the patient have difficulty concentrating, remembering, or making decisions?: No Patient able to express need for assistance with ADLs?: Yes Does the patient have difficulty dressing or bathing?: Yes Independently performs ADLs?: No Communication: Needs assistance Is this a change from baseline?: Pre-admission baseline Dressing (OT): Needs assistance Is this a change from baseline?: Pre-admission baseline Grooming: Needs assistance Is this a change from baseline?: Pre-admission baseline Feeding: Independent Bathing: Needs assistance Is this a change from baseline?: Pre-admission baseline Toileting: Needs assistance Is this a change from baseline?: Pre-admission baseline In/Out Bed: Needs assistance Is this a change from baseline?: Change from baseline, expected to last <3 days Walks in Home: Needs assistance Is this a change from baseline?: Change from baseline, expected to last <3 days Does the patient have difficulty walking or climbing stairs?: Yes Weakness of Legs: Both Weakness of Arms/Hands: Both  Permission Sought/Granted                  Emotional Assessment       Orientation: : Oriented to Self, Oriented to Place, Oriented to  Time, Oriented to Situation Alcohol / Substance Use: Not Applicable Psych Involvement: No (comment)  Admission diagnosis:  Hemoptysis [R04.2] Patient Active Problem List   Diagnosis Date Noted   Malignant neoplasm of overlapping sites of lung [facility])    Multiple lesions of metastatic malignancy ([place])    Bone metastases (  [place])    Abnormal CT scan    Coronary artery disease 07/05/2021   Hemoptysis 07/05/2021   Abnormal CT of the chest 07/05/2021   Hematuria 07/05/2021   Nephrolithiasis 07/05/2021   Abnormal LFTs 07/05/2021   COPD (chronic obstructive pulmonary disease) ([place]) 03/23/2013   PCP:  Gayland Curry, MD Pharmacy:   [id number] [place]  [id number]: [phone]: [phone]    Social Determinants of Health (SDOH) Interventions    Readmission Risk Interventions No flowsheet data found.

## 2021-07-10 NOTE — Telephone Encounter (Signed)
Communicated with patient via interp services to make him aware of future appointments for MRI and f/u with Dr. Janese Banks. Patient confirmed days and times. Left him with my direct ext if he has any questions.

## 2021-07-11 LAB — SURGICAL PATHOLOGY

## 2021-07-12 ENCOUNTER — Encounter: Payer: Self-pay | Admitting: *Deleted

## 2021-07-12 ENCOUNTER — Other Ambulatory Visit: Payer: Self-pay | Admitting: Oncology

## 2021-07-12 NOTE — Progress Notes (Signed)
Foundation One requisition faxed to pathology to send out sample for further testing.

## 2021-07-13 ENCOUNTER — Encounter: Payer: Self-pay | Admitting: *Deleted

## 2021-07-17 ENCOUNTER — Encounter: Payer: Self-pay | Admitting: Oncology

## 2021-07-19 ENCOUNTER — Other Ambulatory Visit: Payer: Medicare HMO

## 2021-07-19 ENCOUNTER — Encounter: Payer: Self-pay | Admitting: Oncology

## 2021-07-19 NOTE — Progress Notes (Signed)
Tumor Board Documentation  Frederick Mitchell was presented by Norvel Richards, RN at our Tumor Board on 07/19/2021, which included representatives from medical oncology, radiation oncology, internal medicine, navigation, pathology, radiology, surgical, pharmacy, genetics, research, palliative care.  Frederick Mitchell currently presents as a current patient, for Frederick Mitchell, for new positive pathology with history of the following treatments: surgical intervention(s).  Additionally, we reviewed previous medical and familial history, history of present illness, and recent lab results along with all available histopathologic and imaging studies. The tumor board considered available treatment options and made the following recommendations: Additional screening (MRI brain, NGS, Foundation testing)    The following procedures/referrals were also placed: No orders of the defined types were placed in this encounter.   Clinical Trial Status: not discussed   Staging used: Pathologic Stage AJCC Staging:       Group: Adenocarcinoma of lung with Bone Metastesis   National site-specific guidelines   were discussed with respect to the case.  Tumor board is a meeting of clinicians from various specialty areas who evaluate and discuss patients for whom a multidisciplinary approach is being considered. Final determinations in the plan of care are those of the provider(s). The responsibility for follow up of recommendations given during tumor board is that of the provider.   Todays extended care, comprehensive team conference, Frederick Mitchell was not present for the discussion and was not examined.   Multidisciplinary Tumor Board is a multidisciplinary case peer review process.  Decisions discussed in the Multidisciplinary Tumor Board reflect the opinions of the specialists present at the conference without having examined the patient.  Ultimately, treatment and diagnostic decisions rest with the primary provider(s) and the patient.

## 2021-07-20 ENCOUNTER — Encounter: Payer: Self-pay | Admitting: Oncology

## 2021-07-23 ENCOUNTER — Emergency Department: Payer: Medicare HMO

## 2021-07-23 ENCOUNTER — Inpatient Hospital Stay
Admission: EM | Admit: 2021-07-23 | Discharge: 2021-07-26 | DRG: 871 | Disposition: A | Discharge status: Hospice | Payer: Medicare HMO | Attending: Internal Medicine | Admitting: Internal Medicine

## 2021-07-23 ENCOUNTER — Other Ambulatory Visit: Payer: Self-pay

## 2021-07-23 DIAGNOSIS — C3481 Malignant neoplasm of overlapping sites of right bronchus and lung: Secondary | ICD-10-CM | POA: Diagnosis present

## 2021-07-23 DIAGNOSIS — F0390 Unspecified dementia without behavioral disturbance: Secondary | ICD-10-CM | POA: Diagnosis present

## 2021-07-23 DIAGNOSIS — Z8249 Family history of ischemic heart disease and other diseases of the circulatory system: Secondary | ICD-10-CM

## 2021-07-23 DIAGNOSIS — G9341 Metabolic encephalopathy: Secondary | ICD-10-CM | POA: Diagnosis present

## 2021-07-23 DIAGNOSIS — Z20822 Contact with and (suspected) exposure to covid-19: Secondary | ICD-10-CM | POA: Diagnosis present

## 2021-07-23 DIAGNOSIS — C7971 Secondary malignant neoplasm of right adrenal gland: Secondary | ICD-10-CM | POA: Diagnosis present

## 2021-07-23 DIAGNOSIS — G936 Cerebral edema: Secondary | ICD-10-CM | POA: Diagnosis present

## 2021-07-23 DIAGNOSIS — R042 Hemoptysis: Secondary | ICD-10-CM | POA: Diagnosis present

## 2021-07-23 DIAGNOSIS — H9190 Unspecified hearing loss, unspecified ear: Secondary | ICD-10-CM | POA: Diagnosis present

## 2021-07-23 DIAGNOSIS — C7951 Secondary malignant neoplasm of bone: Secondary | ICD-10-CM | POA: Diagnosis present

## 2021-07-23 DIAGNOSIS — J441 Chronic obstructive pulmonary disease with (acute) exacerbation: Secondary | ICD-10-CM | POA: Diagnosis present

## 2021-07-23 DIAGNOSIS — Z515 Encounter for palliative care: Secondary | ICD-10-CM | POA: Diagnosis not present

## 2021-07-23 DIAGNOSIS — C7931 Secondary malignant neoplasm of brain: Secondary | ICD-10-CM | POA: Diagnosis present

## 2021-07-23 DIAGNOSIS — E162 Hypoglycemia, unspecified: Secondary | ICD-10-CM | POA: Diagnosis present

## 2021-07-23 DIAGNOSIS — C3490 Malignant neoplasm of unspecified part of unspecified bronchus or lung: Secondary | ICD-10-CM | POA: Diagnosis not present

## 2021-07-23 DIAGNOSIS — Z79899 Other long term (current) drug therapy: Secondary | ICD-10-CM

## 2021-07-23 DIAGNOSIS — R6521 Severe sepsis with septic shock: Secondary | ICD-10-CM | POA: Diagnosis present

## 2021-07-23 DIAGNOSIS — Z825 Family history of asthma and other chronic lower respiratory diseases: Secondary | ICD-10-CM

## 2021-07-23 DIAGNOSIS — G9340 Encephalopathy, unspecified: Secondary | ICD-10-CM

## 2021-07-23 DIAGNOSIS — R509 Fever, unspecified: Secondary | ICD-10-CM | POA: Diagnosis present

## 2021-07-23 DIAGNOSIS — R197 Diarrhea, unspecified: Secondary | ICD-10-CM | POA: Diagnosis present

## 2021-07-23 DIAGNOSIS — I2510 Atherosclerotic heart disease of native coronary artery without angina pectoris: Secondary | ICD-10-CM | POA: Diagnosis present

## 2021-07-23 DIAGNOSIS — C7901 Secondary malignant neoplasm of right kidney and renal pelvis: Secondary | ICD-10-CM | POA: Diagnosis present

## 2021-07-23 DIAGNOSIS — Z66 Do not resuscitate: Secondary | ICD-10-CM | POA: Diagnosis present

## 2021-07-23 DIAGNOSIS — A419 Sepsis, unspecified organism: Secondary | ICD-10-CM | POA: Diagnosis present

## 2021-07-23 DIAGNOSIS — R6520 Severe sepsis without septic shock: Secondary | ICD-10-CM | POA: Diagnosis not present

## 2021-07-23 DIAGNOSIS — J189 Pneumonia, unspecified organism: Secondary | ICD-10-CM | POA: Diagnosis present

## 2021-07-23 DIAGNOSIS — K219 Gastro-esophageal reflux disease without esophagitis: Secondary | ICD-10-CM | POA: Diagnosis present

## 2021-07-23 DIAGNOSIS — C778 Secondary and unspecified malignant neoplasm of lymph nodes of multiple regions: Secondary | ICD-10-CM | POA: Diagnosis present

## 2021-07-23 DIAGNOSIS — C7902 Secondary malignant neoplasm of left kidney and renal pelvis: Secondary | ICD-10-CM | POA: Diagnosis present

## 2021-07-23 DIAGNOSIS — J440 Chronic obstructive pulmonary disease with acute lower respiratory infection: Secondary | ICD-10-CM | POA: Diagnosis present

## 2021-07-23 DIAGNOSIS — E7800 Pure hypercholesterolemia, unspecified: Secondary | ICD-10-CM | POA: Diagnosis present

## 2021-07-23 DIAGNOSIS — F17210 Nicotine dependence, cigarettes, uncomplicated: Secondary | ICD-10-CM | POA: Diagnosis present

## 2021-07-23 DIAGNOSIS — C3482 Malignant neoplasm of overlapping sites of left bronchus and lung: Secondary | ICD-10-CM | POA: Diagnosis present

## 2021-07-23 DIAGNOSIS — R296 Repeated falls: Secondary | ICD-10-CM | POA: Diagnosis present

## 2021-07-23 DIAGNOSIS — Z801 Family history of malignant neoplasm of trachea, bronchus and lung: Secondary | ICD-10-CM

## 2021-07-23 DIAGNOSIS — Z720 Tobacco use: Secondary | ICD-10-CM | POA: Diagnosis present

## 2021-07-23 DIAGNOSIS — E871 Hypo-osmolality and hyponatremia: Secondary | ICD-10-CM | POA: Diagnosis present

## 2021-07-23 DIAGNOSIS — C7972 Secondary malignant neoplasm of left adrenal gland: Secondary | ICD-10-CM | POA: Diagnosis present

## 2021-07-23 DIAGNOSIS — J449 Chronic obstructive pulmonary disease, unspecified: Secondary | ICD-10-CM | POA: Diagnosis present

## 2021-07-23 DIAGNOSIS — M329 Systemic lupus erythematosus, unspecified: Secondary | ICD-10-CM | POA: Diagnosis present

## 2021-07-23 DIAGNOSIS — Z808 Family history of malignant neoplasm of other organs or systems: Secondary | ICD-10-CM

## 2021-07-23 HISTORY — DX: Chronic obstructive pulmonary disease, unspecified: J44.9

## 2021-07-23 LAB — CBC WITH DIFFERENTIAL/PLATELET
Abs Immature Granulocytes: 0.91 10*3/uL — ABNORMAL HIGH (ref 0.00–0.07)
Basophils Absolute: 0 10*3/uL (ref 0.0–0.1)
Basophils Relative: 0 %
Eosinophils Absolute: 0.1 10*3/uL (ref 0.0–0.5)
Eosinophils Relative: 0 %
HCT: 38.3 % — ABNORMAL LOW (ref 39.0–52.0)
Hemoglobin: 12.9 g/dL — ABNORMAL LOW (ref 13.0–17.0)
Immature Granulocytes: 8 %
Lymphocytes Relative: 8 %
Lymphs Abs: 0.9 10*3/uL (ref 0.7–4.0)
MCH: 30.6 pg (ref 26.0–34.0)
MCHC: 33.7 g/dL (ref 30.0–36.0)
MCV: 91 fL (ref 80.0–100.0)
Monocytes Absolute: 0.8 10*3/uL (ref 0.1–1.0)
Monocytes Relative: 7 %
Neutro Abs: 8.7 10*3/uL — ABNORMAL HIGH (ref 1.7–7.7)
Neutrophils Relative %: 77 %
Platelets: 203 10*3/uL (ref 150–400)
RBC: 4.21 MIL/uL — ABNORMAL LOW (ref 4.22–5.81)
RDW: 15.3 % (ref 11.5–15.5)
Smear Review: NORMAL
WBC: 11.4 10*3/uL — ABNORMAL HIGH (ref 4.0–10.5)
nRBC: 0 % (ref 0.0–0.2)

## 2021-07-23 LAB — COMPREHENSIVE METABOLIC PANEL
ALT: 54 U/L — ABNORMAL HIGH (ref 0–44)
AST: 140 U/L — ABNORMAL HIGH (ref 15–41)
Albumin: 2.7 g/dL — ABNORMAL LOW (ref 3.5–5.0)
Alkaline Phosphatase: 99 U/L (ref 38–126)
Anion gap: 9 (ref 5–15)
BUN: 20 mg/dL (ref 8–23)
CO2: 22 mmol/L (ref 22–32)
Calcium: 9.1 mg/dL (ref 8.9–10.3)
Chloride: 93 mmol/L — ABNORMAL LOW (ref 98–111)
Creatinine, Ser: 1.03 mg/dL (ref 0.61–1.24)
GFR, Estimated: >60 mL/min (ref >60)
Glucose, Bld: 60 mg/dL — ABNORMAL LOW (ref 70–99)
Potassium: 3.8 mmol/L (ref 3.5–5.1)
Sodium: 124 mmol/L — ABNORMAL LOW (ref 135–145)
Total Bilirubin: 0.9 mg/dL (ref 0.3–1.2)
Total Protein: 7 g/dL (ref 6.5–8.1)

## 2021-07-23 LAB — URINALYSIS, COMPLETE (UACMP) WITH MICROSCOPIC
Glucose, UA: NEGATIVE mg/dL
Ketones, ur: 15 mg/dL — AB
Leukocytes,Ua: NEGATIVE
Nitrite: NEGATIVE
Protein, ur: 100 mg/dL — AB
RBC / HPF: >50 RBC/hpf (ref 0–5)
Specific Gravity, Urine: 1.015 (ref 1.005–1.030)
pH: 6 (ref 5.0–8.0)

## 2021-07-23 LAB — PROTIME-INR
INR: 1.1 (ref 0.8–1.2)
Prothrombin Time: 14 seconds (ref 11.4–15.2)

## 2021-07-23 LAB — LACTIC ACID, PLASMA
Lactic Acid, Venous: 2.3 mmol/L (ref 0.5–1.9)
Lactic Acid, Venous: 2 mmol/L (ref 0.5–1.9)
Lactic Acid, Venous: 1.5 mmol/L (ref 0.5–1.9)

## 2021-07-23 LAB — OSMOLALITY, URINE: Osmolality, Ur: 291 mOsm/kg — ABNORMAL LOW (ref 300–900)

## 2021-07-23 LAB — STREP PNEUMONIAE URINARY ANTIGEN: Strep Pneumo Urinary Antigen: NEGATIVE

## 2021-07-23 LAB — SODIUM, URINE, RANDOM: Sodium, Ur: <10 mmol/L

## 2021-07-23 LAB — RESP PANEL BY RT-PCR (FLU A&B, COVID) ARPGX2
Influenza A by PCR: NEGATIVE
Influenza B by PCR: NEGATIVE
SARS Coronavirus 2 by RT PCR: NEGATIVE

## 2021-07-23 LAB — APTT: aPTT: 33 seconds (ref 24–36)

## 2021-07-23 MED ORDER — ALBUTEROL SULFATE (2.5 MG/3ML) 0.083% IN NEBU: 2.5000 mg | INHALATION_SOLUTION | RESPIRATORY_TRACT | Status: DC | PRN

## 2021-07-23 MED ORDER — DOXEPIN HCL 50 MG PO CAPS
50.0000 mg | ORAL_CAPSULE | Freq: Every day | ORAL | Status: DC
  Administered 2021-07-24 – 2021-07-26 (×3): 50 mg via ORAL
  Filled 2021-07-23 (×4): qty 1

## 2021-07-23 MED ORDER — ACETAMINOPHEN 325 MG PO TABS: 650.0000 mg | ORAL_TABLET | Freq: Four times a day (QID) | ORAL | Status: DC | PRN

## 2021-07-23 MED ORDER — ONDANSETRON HCL 4 MG/2ML IJ SOLN: 4.0000 mg | Freq: Three times a day (TID) | INTRAMUSCULAR | Status: DC | PRN

## 2021-07-23 MED ORDER — ENOXAPARIN SODIUM 40 MG/0.4ML IJ SOSY
40.0000 mg | PREFILLED_SYRINGE | INTRAMUSCULAR | Status: DC
  Administered 2021-07-25 (×2): 40 mg via SUBCUTANEOUS
  Filled 2021-07-23 (×2): qty 0.4

## 2021-07-23 MED ORDER — NICOTINE 21 MG/24HR TD PT24
21.0000 mg | MEDICATED_PATCH | Freq: Every day | TRANSDERMAL | Status: DC
  Filled 2021-07-23 (×2): qty 1

## 2021-07-23 MED ORDER — VANCOMYCIN HCL 1500 MG/300ML IV SOLN
1500.0000 mg | Freq: Once | INTRAVENOUS | Status: AC
Start: 2021-07-23 — End: 2021-07-23
  Administered 2021-07-23: 14:00:00 1500 mg via INTRAVENOUS
  Filled 2021-07-23: qty 300

## 2021-07-23 MED ORDER — DM-GUAIFENESIN ER 30-600 MG PO TB12: 1.0000 | ORAL_TABLET | Freq: Two times a day (BID) | ORAL | Status: DC | PRN

## 2021-07-23 MED ORDER — DEXTROSE 50 % IV SOLN: 50.0000 mL | INTRAVENOUS | Status: DC | PRN

## 2021-07-23 MED ORDER — SODIUM CHLORIDE 0.9 % IV SOLN: INTRAVENOUS | Status: DC

## 2021-07-23 MED ORDER — SODIUM CHLORIDE 0.9 % IV SOLN
2.0000 g | INTRAVENOUS | Status: DC
  Administered 2021-07-23: 11:00:00 2 g via INTRAVENOUS
  Filled 2021-07-23: qty 20

## 2021-07-23 MED ORDER — IPRATROPIUM-ALBUTEROL 0.5-2.5 (3) MG/3ML IN SOLN
3.0000 mL | Freq: Once | RESPIRATORY_TRACT | Status: AC
  Administered 2021-07-23: 12:00:00 3 mL via RESPIRATORY_TRACT
  Filled 2021-07-23: qty 3

## 2021-07-23 MED ORDER — LACTATED RINGERS IV BOLUS (SEPSIS)
1000.0000 mL | Freq: Once | INTRAVENOUS | Status: AC
  Administered 2021-07-23: 11:00:00 1000 mL via INTRAVENOUS

## 2021-07-23 MED ORDER — NOREPINEPHRINE 4 MG/250ML-% IV SOLN
0.0000 ug/min | INTRAVENOUS | Status: DC
  Administered 2021-07-23: 15:00:00 2 ug/min via INTRAVENOUS
  Administered 2021-07-24: 14:00:00 6 ug/min via INTRAVENOUS
  Filled 2021-07-23 (×4): qty 250

## 2021-07-23 MED ORDER — LACTATED RINGERS IV BOLUS
1000.0000 mL | Freq: Once | INTRAVENOUS | Status: AC
  Administered 2021-07-23: 14:00:00 1000 mL via INTRAVENOUS

## 2021-07-23 MED ORDER — SODIUM CHLORIDE 0.9 % IV SOLN
500.0000 mg | Freq: Once | INTRAVENOUS | Status: AC
  Administered 2021-07-23: 13:00:00 500 mg via INTRAVENOUS
  Filled 2021-07-23: qty 5

## 2021-07-23 MED ORDER — IPRATROPIUM-ALBUTEROL 0.5-2.5 (3) MG/3ML IN SOLN
3.0000 mL | RESPIRATORY_TRACT | Status: DC
  Administered 2021-07-23 – 2021-07-26 (×7): 3 mL via RESPIRATORY_TRACT
  Filled 2021-07-23 (×7): qty 3

## 2021-07-23 MED ORDER — METHYLPREDNISOLONE SODIUM SUCC 40 MG IJ SOLR
40.0000 mg | Freq: Two times a day (BID) | INTRAMUSCULAR | Status: DC
  Administered 2021-07-23 – 2021-07-25 (×4): 40 mg via INTRAVENOUS
  Filled 2021-07-23 (×4): qty 1

## 2021-07-23 MED ORDER — LACTATED RINGERS IV SOLN: INTRAVENOUS | Status: DC

## 2021-07-23 MED ORDER — VANCOMYCIN HCL 1500 MG/300ML IV SOLN
1500.0000 mg | INTRAVENOUS | Status: DC
  Filled 2021-07-23: qty 300

## 2021-07-23 MED ORDER — HYDROXYZINE HCL 10 MG PO TABS
10.0000 mg | ORAL_TABLET | Freq: Three times a day (TID) | ORAL | Status: DC | PRN
  Filled 2021-07-23: qty 1

## 2021-07-23 MED ORDER — HYDROCORTISONE SOD SUC (PF) 100 MG IJ SOLR
100.0000 mg | Freq: Once | INTRAMUSCULAR | Status: AC
  Administered 2021-07-23: 16:00:00 100 mg via INTRAVENOUS
  Filled 2021-07-23: qty 2

## 2021-07-23 MED ORDER — SODIUM CHLORIDE 0.9 % IV SOLN
2.0000 g | Freq: Three times a day (TID) | INTRAVENOUS | Status: DC
  Administered 2021-07-23 – 2021-07-26 (×8): 2 g via INTRAVENOUS
  Filled 2021-07-23 (×8): qty 2

## 2021-07-23 NOTE — Progress Notes (Signed)
I was asked by Dr. Quentin Cornwall to evaluate the patient for potential SDU/ICU admission.  This is an unfortunate 65 year old gentleman with a recent diagnosis of adenocarcinoma of lung origin stage IV.  He has extensive metastatic disease to include omental and adrenal metastasis.  Over the last 8 days his p.o. intake has decreased.  He has had multiple falls and has issues with ataxia.  He presented initially with shortness of breath and hemoptysis.  Imaging performed today is consistent with potential lymphangitic spread of tumor as well as increasing omental deposits.  The patient has been noted to be hypotensive and received IV fluids and now is on low-dose norepinephrine.  Patient's sister and niece are at bedside.  Patient is hearing impaired so American sign language interpreter via video was utilized to present information.  I discussed the severity of the situation and that I believe that his symptoms are due to progressive cancer (carcinomatosis).  Indicated and this was corroborated by his sister and niece, that he is to be DNR/DNI.  They are interested in pursuing palliative care and if condition worsens transitioning to comfort measures.  Currently would treat only with conservative measures and would not treat with advanced maintenance medications.  It was explained to them that all these interventions would only prolong suffering in his situation.  Given his adrenal metastasis a trial of stress dose steroids may be reasonable.  They are interested in pursuing MRI of the brain that was ordered for tomorrow, this could probably be achieved while the patient is in-house.  Recommend consulting oncology palliative care (Josh Borders,NP) in the morning.  I have sent a message to Dr. Rogue Bussing who is on-call for oncology today so that he can be seen by oncology in the morning.  Recommend admission by hospitalist service.   Renold Don, MD Advanced Bronchoscopy PCC[facility] [place]  [facility]    *This note was dictated using voice recognition software/Dragon.  Despite best efforts to proofread, errors can occur which can change the meaning. Any transcriptional errors that result from this process are unintentional and may not be fully corrected at the time of dictation.

## 2021-07-23 NOTE — ED Notes (Signed)
Pt to CT

## 2021-07-23 NOTE — Sepsis Progress Note (Signed)
Sepsis protocol followed by eLink 

## 2021-07-23 NOTE — ED Provider Notes (Signed)
[facility] Emergency Department Provider Note    Event Date/Time   First MD Initiated Contact with Patient 07/23/21 1049     (approximate)  I have reviewed the triage vital signs and the nursing notes.   HISTORY  Chief Complaint Code Sepsis    HPI Frederick Mitchell is a 65 y.o. male with a history of COPD and recent admission to hospital with diagnosis of stage IV lung cancer biopsy showing adenocarcinoma presents to the ER for weakness fever chills generalized malaise.  Is having some dysuria.  Not currently on any antibiotics.  Is on supplemental oxygen found to be febrile to 100.4 with EMS.  Was found to be hypotensive.   Past Medical History:  Diagnosis Date   COPD (chronic obstructive pulmonary disease) ([place])    Deaf    Hypercholesteremia unk   Family History  Problem Relation Age of Onset   Lung cancer Father 59   Lung cancer Brother 66   COPD Maternal Grandmother 99   Heart failure Maternal Grandfather 52   Lung cancer Paternal Grandfather 85   Brain cancer Paternal Uncle 20   Lung cancer Paternal Uncle 30       possible lung cancer with metastatic disease. not sure of origin.   Lung cancer Paternal Uncle 18   Past Surgical History:  Procedure Laterality Date   EYE SURGERY     Patient Active Problem List   Diagnosis Date Noted   Malignant neoplasm of overlapping sites of lung [facility])    Multiple lesions of metastatic malignancy ([place])    Bone metastases ([place])    Abnormal CT scan    Coronary artery disease 07/05/2021   Hemoptysis 07/05/2021   Abnormal CT of the chest 07/05/2021   Hematuria 07/05/2021   Nephrolithiasis 07/05/2021   Abnormal LFTs 07/05/2021   COPD (chronic obstructive pulmonary disease) ([place]) 03/23/2013      Prior to Admission medications   Medication Sig Start Date End Date Taking? Authorizing Provider  doxepin (SINEQUAN) 50 MG capsule Take 50 mg by mouth at bedtime.   Yes [provider]  fluticasone  (FLONASE) 50 MCG/ACT nasal spray SHAKE LIQUID AND USE 2 SPRAYS IN EACH NOSTRIL EVERY DAY 11/17/19  Yes [provider]  Fluticasone-Umeclidin-Vilant (TRELEGY ELLIPTA) 100-62.5-25 MCG/ACT AEPB Inhale 2 sprays into the lungs 2 (two) times daily. 01/11/20  Yes [provider]  hydroxychloroquine (PLAQUENIL) 200 MG tablet Take 200 mg by mouth 2 (two) times daily. 06/11/21  Yes [provider]  hydrOXYzine (ATARAX) 10 MG tablet Take 10 mg by mouth 3 (three) times daily as needed. 06/25/21 06/25/22 Yes [provider]  Vitamin D, Ergocalciferol, (DRISDOL) 1.25 MG (50000 UNIT) CAPS capsule Take 50,000 Units by mouth 3 (three) times a week. 05/16/21  Yes [provider]  carboxymethylcellulose (REFRESH PLUS) 0.5 % SOLN Apply to eye. Patient not taking: Reported on 07/23/2021    [provider]  clobetasol (TEMOVATE) 0.05 % external solution Apply 1 application topically 2 (two) times daily. Patient not taking: Reported on 07/23/2021 06/17/21   [provider]  nicotine (NICODERM CQ - DOSED IN MG/24 HOURS) 21 mg/24hr patch Place 1 patch (21 mg total) onto the skin daily for 14 days. Patient not taking: Reported on 07/23/2021 07/10/21 07/24/21  Nolberto Hanlon, MD  pantoprazole (PROTONIX) 40 MG tablet Take 1 tablet (40 mg total) by mouth daily. Patient not taking: Reported on 07/23/2021 07/09/21 08/08/21  Nolberto Hanlon, MD  predniSONE (DELTASONE) 10 MG tablet  Take 10 mg by mouth daily. Patient not taking: Reported on 07/23/2021 01/16/21   [provider]  triamcinolone (KENALOG) 0.025 % cream Apply 1 application topically 3 (three) times daily. Patient not taking: Reported on 07/23/2021    [provider]    Allergies Patient has no known allergies.    Social History Social History   Tobacco Use   Smoking status: Every Day    Types: Cigarettes   Smokeless tobacco: Never  Substance Use Topics   Alcohol use: No   Drug use:  Not Currently    Review of Systems Patient denies headaches, rhinorrhea, blurry vision, numbness, shortness of breath, chest pain, edema, cough, abdominal pain, nausea, vomiting, diarrhea, dysuria, fevers, rashes or hallucinations unless otherwise stated above in HPI. ____________________________________________   PHYSICAL EXAM:  VITAL SIGNS: Vitals:   07/23/21 1530 07/23/21 1545  BP: (!) 97/51 (!) 98/47  Pulse: 95 91  Resp: 19 20  Temp:    SpO2: 98% 96%    Constitutional: Alert and oriented.  Eyes: Conjunctivae are normal.  Head: Atraumatic. Nose: No congestion/rhinnorhea. Mouth/Throat: Mucous membranes are moist.   Neck: No stridor. Painless ROM.  Cardiovascular: Normal rate, regular rhythm. Grossly normal heart sounds.  Good peripheral circulation. Respiratory: Normal respiratory effort.  But requiring suplpemtnal 02 with diffuse rhonchi throughout Gastrointestinal: Soft and nontender. No distention. No abdominal bruits. No CVA tenderness. Genitourinary:  Musculoskeletal: No lower extremity tenderness nor edema.  No joint effusions. Neurologic:  Normal speech and language. No gross focal neurologic deficits are appreciated. No facial droop Skin:  Skin is warm, dry and intact. No rash noted. Psychiatric: Mood and affect are normal. Speech and behavior are normal.  ____________________________________________   LABS (all labs ordered are listed, but only abnormal results are displayed)  Results for orders placed or performed during the hospital encounter of 07/23/21 (from the past 24 hour(s))  Lactic acid, plasma     Status: Abnormal   Collection Time: 07/23/21 10:51 AM  Result Value Ref Range   Lactic Acid, Venous 2.3 (HH) 0.5 - 1.9 mmol/L  Comprehensive metabolic panel     Status: Abnormal   Collection Time: 07/23/21 10:51 AM  Result Value Ref Range   Sodium 124 (L) 135 - 145 mmol/L   Potassium 3.8 3.5 - 5.1 mmol/L   Chloride 93 (L) 98 - 111 mmol/L   CO2 22 22 -  32 mmol/L   Glucose, Bld 60 (L) 70 - 99 mg/dL   BUN 20 8 - 23 mg/dL   Creatinine, Ser 1.03 0.61 - 1.24 mg/dL   Calcium 9.1 8.9 - 10.3 mg/dL   Total Protein 7.0 6.5 - 8.1 g/dL   Albumin 2.7 (L) 3.5 - 5.0 g/dL   AST 140 (H) 15 - 41 U/L   ALT 54 (H) 0 - 44 U/L   Alkaline Phosphatase 99 38 - 126 U/L   Total Bilirubin 0.9 0.3 - 1.2 mg/dL   GFR, Estimated >60 >60 mL/min   Anion gap 9 5 - 15  CBC WITH DIFFERENTIAL     Status: Abnormal   Collection Time: 07/23/21 10:51 AM  Result Value Ref Range   WBC 11.4 (H) 4.0 - 10.5 K/uL   RBC 4.21 (L) 4.22 - 5.81 MIL/uL   Hemoglobin 12.9 (L) 13.0 - 17.0 g/dL   HCT 38.3 (L) 39.0 - 52.0 %   MCV 91.0 80.0 - 100.0 fL   MCH 30.6 26.0 - 34.0 pg   MCHC 33.7 30.0 - 36.0 g/dL  RDW 15.3 11.5 - 15.5 %   Platelets 203 150 - 400 K/uL   nRBC 0.0 0.0 - 0.2 %   Neutrophils Relative % 77 %   Neutro Abs 8.7 (H) 1.7 - 7.7 K/uL   Lymphocytes Relative 8 %   Lymphs Abs 0.9 0.7 - 4.0 K/uL   Monocytes Relative 7 %   Monocytes Absolute 0.8 0.1 - 1.0 K/uL   Eosinophils Relative 0 %   Eosinophils Absolute 0.1 0.0 - 0.5 K/uL   Basophils Relative 0 %   Basophils Absolute 0.0 0.0 - 0.1 K/uL   WBC Morphology MILD LEFT SHIFT (1-5% METAS, OCC MYELO, OCC BANDS)    RBC Morphology MORPHOLOGY UNREMARKABLE    Smear Review Normal platelet morphology    Immature Granulocytes 8 %   Abs Immature Granulocytes 0.91 (H) 0.00 - 0.07 K/uL  Protime-INR     Status: None   Collection Time: 07/23/21 10:51 AM  Result Value Ref Range   Prothrombin Time 14.0 11.4 - 15.2 seconds   INR 1.1 0.8 - 1.2  APTT     Status: None   Collection Time: 07/23/21 10:51 AM  Result Value Ref Range   aPTT 33 24 - 36 seconds  Urinalysis, Complete w Microscopic     Status: Abnormal   Collection Time: 07/23/21 10:51 AM  Result Value Ref Range   Color, Urine AMBER (A) YELLOW   APPearance CLEAR CLEAR   Specific Gravity, Urine 1.015 1.005 - 1.030   pH 6.0 5.0 - 8.0   Glucose, UA NEGATIVE NEGATIVE mg/dL    Hgb urine dipstick LARGE (A) NEGATIVE   Bilirubin Urine SMALL (A) NEGATIVE   Ketones, ur 15 (A) NEGATIVE mg/dL   Protein, ur 100 (A) NEGATIVE mg/dL   Nitrite NEGATIVE NEGATIVE   Leukocytes,Ua NEGATIVE NEGATIVE   Squamous Epithelial / LPF 0-5 0 - 5   WBC, UA 0-5 0 - 5 WBC/hpf   RBC / HPF >50 0 - 5 RBC/hpf   Bacteria, UA MANY (A) NONE SEEN  Resp Panel by RT-PCR (Flu A&B, Covid) Nasopharyngeal Swab     Status: None   Collection Time: 07/23/21 10:56 AM   Specimen: Nasopharyngeal Swab; Nasopharyngeal(NP) swabs in vial transport medium  Result Value Ref Range   SARS Coronavirus 2 by RT PCR NEGATIVE NEGATIVE   Influenza A by PCR NEGATIVE NEGATIVE   Influenza B by PCR NEGATIVE NEGATIVE  Lactic acid, plasma     Status: Abnormal   Collection Time: 07/23/21 12:22 PM  Result Value Ref Range   Lactic Acid, Venous 2.0 (HH) 0.5 - 1.9 mmol/L   ____________________________________________  EKG My review and personal interpretation at Time: 10:54   Indication: weakness  Rate: 90  Rhythm: sinus Axis: normal Other: normal intervals, no stemi ____________________________________________  RADIOLOGY  I personally reviewed all radiographic images ordered to evaluate for the above acute complaints and reviewed radiology reports and findings.  These findings were personally discussed with the patient.  Please see medical record for radiology report.  ____________________________________________   PROCEDURES  Procedure(s) performed:  .Critical Care Performed by: Merlyn Lot, MD Authorized by: Merlyn Lot, MD   Critical care provider statement:    Critical care time (minutes):  45   Critical care was necessary to treat or prevent imminent or life-threatening deterioration of the following conditions:  Respiratory failure and sepsis   Critical care was time spent personally by me on the following activities:  Ordering and performing treatments and interventions, ordering and review of  laboratory studies, ordering and review of radiographic studies, pulse oximetry, re-evaluation of patient's condition, review of old charts, obtaining history from patient or surrogate, examination of patient, evaluation of patient's response to treatment, discussions with primary provider, discussions with consultants and development of treatment plan with patient or surrogate    Critical Care performed: yes ____________________________________________   INITIAL IMPRESSION / ASSESSMENT AND PLAN / ED COURSE  Pertinent labs & imaging results that were available during my care of the patient were reviewed by me and considered in my medical decision making (see chart for details).   DDX: Dehydration, sepsis, pna, uti, hypoglycemia, cva, drug effect, withdrawal, encephalitis   AVION Mitchell is a 65 y.o. who presents to the ED with presentation as described above.  Presentation complicated by recent diagnosis of stage IV adenocarcinoma.  On home oxygen poorly has had pretty significant decline over the past few days.  Arrives hypotensive and hypoxic.  Will give IV fluids and suspect dehydration blood will be ordered for above differential.  Placed on supplemental oxygen.  Clinical Course as of 07/23/21 1559  Mon Jul 23, 2021  1220 Urinalysis with many bacteria with hematuria.  Will order CT renal to evaluate for stone.  Patient receiving IV antibiotics.  Receiving IV fluids. [PR]  1155 Despite IV fluid resuscitation patient's maps are still low less than 65.  Will start nor epi.  Will consult intensivist. [PR]  1555 Patient evaluated by pulm critical care at bedside.  Suspect this is more secondary to carcinomatosis than sepsis.  Has recommended stress dose steroids and hospitalist admission.  Family now states he is a DNR/DNI. [PR]    Clinical Course User Index [PR] Merlyn Lot, MD    The patient was evaluated in Emergency Department today for the symptoms described in the history of  present illness. He/she was evaluated in the context of the global COVID-19 pandemic, which necessitated consideration that the patient might be at risk for infection with the SARS-CoV-2 virus that causes COVID-19. Institutional protocols and algorithms that pertain to the evaluation of patients at risk for COVID-19 are in a state of rapid change based on information released by regulatory bodies including the CDC and federal and state organizations. These policies and algorithms were followed during the patient's care in the ED.  As part of my medical decision making, I reviewed the following data within the [place] notes reviewed and incorporated, Labs reviewed, notes from prior ED visits and [place] Controlled Substance Database   ____________________________________________   FINAL CLINICAL IMPRESSION(S) / ED DIAGNOSES  Final diagnoses:  Sepsis with encephalopathy and septic shock, due to unspecified organism [facility])      NEW MEDICATIONS STARTED DURING THIS VISIT:  New Prescriptions   No medications on file     Note:  This document was prepared using Dragon voice recognition software and may include unintentional dictation errors.    Merlyn Lot, MD 07/23/21 [phone]

## 2021-07-23 NOTE — Progress Notes (Signed)
CODE SEPSIS - PHARMACY COMMUNICATION  **Broad Spectrum Antibiotics should be administered within 1 hour of Sepsis diagnosis**  Time Code Sepsis Called/Page Received: 1056  Antibiotics Ordered: ceftriaxone  Time of 1st antibiotic administration: [place] ,PharmD Clinical Pharmacist  07/23/2021  10:58 AM

## 2021-07-23 NOTE — ED Notes (Signed)
Dr Quentin Cornwall notified lactic 2.3. orders to be placed as needed

## 2021-07-23 NOTE — ED Notes (Signed)
Pt continues to require 2L 

## 2021-07-23 NOTE — ED Notes (Signed)
Dr Quentin Cornwall aware of hypotension. Remainder of LR bolus on pressure bag.

## 2021-07-23 NOTE — ED Notes (Signed)
Pt noted to be coughing up thick sputum

## 2021-07-23 NOTE — ED Triage Notes (Signed)
Pt deaf. Interpreter [phone] used Pt reports he is not eating right for the past week d/t cause Ems reports hypotensive, HR 100  500NS given, 18G to left hand and right AC PTA  Reports stage 4 cancer "all over", no chemo  Reports shob that usual from COPD. Does not wear O2 at home  Reports multiple falls yesterday  Pt cannot answer what medication allergies he has

## 2021-07-23 NOTE — ED Notes (Signed)
Dr Quentin Cornwall notified of BP 87/53, orders to be placed as needed

## 2021-07-23 NOTE — Progress Notes (Signed)
PHARMACY -  BRIEF ANTIBIOTIC NOTE   Pharmacy has received consult(s) for vancomycin from an ED provider.  The patient's profile has been reviewed for ht/wt/allergies/indication/available labs.    One time order(s) placed for vancomycin 1500 mg  Further antibiotics/pharmacy consults should be ordered by admitting physician if indicated.                       Thank you, Forde Dandy Shasta Chinn 07/23/2021  12:53 PM

## 2021-07-23 NOTE — ED Notes (Signed)
Blood cultures collected before antibiotics started.

## 2021-07-23 NOTE — Progress Notes (Signed)
Pharmacy Antibiotic Note  Frederick Mitchell is a 65 y.o. male admitted on 07/23/2021 with HCAP.  Pharmacy has been consulted for cefepime and vancomycin dosing.  Plan: Cefepime 2 g IV q8h Pt received vancomycin 1500 mg IV x 1 loading dose in ED. Will continue with vancomycin 1500 mg IV q24h Est AUC: 475.6 Used: Scr 1.03, IBW, Vd 0.72 Obtain vanc level around 4th or 5th dose if continued Monitor renal function and adjust dose as clinically indicated Follow up MRSA PCR  Height: 5\' 7"  (170.2 cm) Weight: 75 kg (165 lb 5.5 oz) IBW/kg (Calculated) : 66.1  Temp (24hrs), Avg:98.4 F (36.9 C), Min:98.4 F (36.9 C), Max:98.4 F (36.9 C)  Recent Labs  Lab 07/23/21 1051 07/23/21 1222  WBC 11.4*  --   CREATININE 1.03  --   LATICACIDVEN 2.3* 2.0*    Estimated Creatinine Clearance: 65.1 mL/min (by C-G formula based on SCr of 1.03 mg/dL).    No Known Allergies  Antimicrobials this admission: 12/26 ceftriaxone/azithromycin x 1 12/26 cefepime >>  12/26 vancomycin >>    Microbiology results: 12/26 BCx: sent 12/26 UCx: sent 12/26 MRSA PCR: pending collection  Thank you for allowing pharmacy to be a part of this patients care.  Forde Dandy Gil Ingwersen 07/23/2021 5:36 PM

## 2021-07-23 NOTE — H&P (Signed)
History and Physical    Frederick Mitchell JQB:[id number] DOB: Aug 07, 1955 DOA: 07/23/2021  Referring MD/NP/PA:   PCP: Gayland Curry, MD   Patient coming from:  The patient is coming from home.  At baseline, pt is independent for most of ADL.        Chief Complaint: SOB  HPI: Frederick Mitchell is a 65 y.o. male with medical history significant of deaf, hyperlipidemia, COPD not on oxygen at home, GERD, CAD, kidney stone, lupus, dementia, recently diagnosed metastasized to lung cancer, who presents with shortness of breath.  Patient is deaf.  History is obtained with help of iPad Sign Language interpreter.  Patient states that in the past several days he has generalized weakness, malaise, cough, shortness of breath, fever and chills.  Patient had fever 100.4 at home.  He coughs up brownish colored sputum.  Denies chest pain.  Patient has had several episodes of diarrhea.  He has nausea, no vomiting or abdominal pain.  Patient has poor appetite and decreased oral intake.  He also reports dysuria and burning on urination.   Patient was found to have hypotension with 78/45.  Levophed drip is started in the ED.  ED Course: pt was found to have WBC 11.4, sodium 124, lactic acid 2.3, 2.0, INR 1.1, negative COVID PCR, urinalysis (clear appearance, negative leukocyte, many bacteria, WBC 0-5), creatinine 1.03, BUN 20.  Temperature 98.4, heart rate 99, RR 24, oxygen saturation 92-96% on 2 L oxygen.  Patient is admitted to stepdown as inpatient.  Dr. Patsey Berthold of PCCM is consulted.  CXR showed: 1. Worsening pulmonary edema or pulmonary infection pattern. 2. Underlying bilateral pulmonary nodules consistent with pulmonary metastasis. 3. Consider head CT or MRI to exclude brain metastasis with multiple recent falls and new metastatic lung cancer  CT-renal stone: Extensive metastatic disease in the chest, abdomen and bony pelvis as described above. This appears progressive with enlarging pulmonary nodules  and omental/peritoneal nodules.   Infiltrating masses within both kidneys are difficult to visualize without IV contrast, but likely similar to prior study.   Bilateral nephrolithiasis. Small layering urinary bladder stones. No ureteral stones or hydronephrosis.   Trace bilateral pleural effusions.   Aortic atherosclerosis, coronary artery disease.   Review of Systems:   General:  has fevers, chills, no body weight gain, has poor appetite, has fatigue HEENT: no blurry vision, hearing changes or sore throat Respiratory: has dyspnea, coughing, no wheezing CV: no chest pain, no palpitations GI: has diarrhea and nausea, no vomiting, abdominal pain, constipation GU: no dysuria, burning on urination, increased urinary frequency, hematuria  Ext: no leg edema Neuro: no unilateral weakness, numbness, or tingling, no vision change or hearing loss Skin: no rash, no skin tear. MSK: No muscle spasm, no deformity, no limitation of range of movement in spin Heme: No easy bruising.  Travel history: No recent long distant travel.  Allergy: No Known Allergies  Past Medical History:  Diagnosis Date   COPD (chronic obstructive pulmonary disease) ([place])    Deaf    Hypercholesteremia unk    Past Surgical History:  Procedure Laterality Date   EYE SURGERY      Social History:  reports that he has been smoking cigarettes. He has never used smokeless tobacco. He reports that he does not currently use drugs. He reports that he does not drink alcohol.  Family History:  Family History  Problem Relation Age of Onset   Lung cancer Father 17   Lung cancer Brother 95  COPD Maternal Grandmother 84   Heart failure Maternal Grandfather 85   Lung cancer Paternal Grandfather 60   Brain cancer Paternal Uncle 77   Lung cancer Paternal Uncle 91       possible lung cancer with metastatic disease. not sure of origin.   Lung cancer Paternal Uncle 60     Prior to Admission medications   Medication Sig  Start Date End Date Taking? Authorizing Provider  doxepin (SINEQUAN) 50 MG capsule Take 50 mg by mouth at bedtime.   Yes [provider]  fluticasone (FLONASE) 50 MCG/ACT nasal spray SHAKE LIQUID AND USE 2 SPRAYS IN EACH NOSTRIL EVERY DAY 11/17/19  Yes [provider]  Fluticasone-Umeclidin-Vilant (TRELEGY ELLIPTA) 100-62.5-25 MCG/ACT AEPB Inhale 2 sprays into the lungs 2 (two) times daily. 01/11/20  Yes [provider]  hydroxychloroquine (PLAQUENIL) 200 MG tablet Take 200 mg by mouth 2 (two) times daily. 06/11/21  Yes [provider]  hydrOXYzine (ATARAX) 10 MG tablet Take 10 mg by mouth 3 (three) times daily as needed. 06/25/21 06/25/22 Yes [provider]  Vitamin D, Ergocalciferol, (DRISDOL) 1.25 MG (50000 UNIT) CAPS capsule Take 50,000 Units by mouth 3 (three) times a week. 05/16/21  Yes [provider]  carboxymethylcellulose (REFRESH PLUS) 0.5 % SOLN Apply to eye. Patient not taking: Reported on 07/23/2021    [provider]  clobetasol (TEMOVATE) 0.05 % external solution Apply 1 application topically 2 (two) times daily. Patient not taking: Reported on 07/23/2021 06/17/21   [provider]  nicotine (NICODERM CQ - DOSED IN MG/24 HOURS) 21 mg/24hr patch Place 1 patch (21 mg total) onto the skin daily for 14 days. Patient not taking: Reported on 07/23/2021 07/10/21 07/24/21  Nolberto Hanlon, MD  pantoprazole (PROTONIX) 40 MG tablet Take 1 tablet (40 mg total) by mouth daily. Patient not taking: Reported on 07/23/2021 07/09/21 08/08/21  Nolberto Hanlon, MD  predniSONE (DELTASONE) 10 MG tablet Take 10 mg by mouth daily. Patient not taking: Reported on 07/23/2021 01/16/21   [provider]  triamcinolone (KENALOG) 0.025 % cream Apply 1 application topically 3 (three) times daily. Patient not taking: Reported on 07/23/2021    [provider]    Physical Exam: Vitals:   07/23/21 1715 07/23/21 1738 07/23/21 1800  07/23/21 1815  BP: (!) 106/54  (!) 103/56 (!) 111/53  Pulse: 83  80 94  Resp: (!) 23  (!) 22 19  Temp:  98.5 F (36.9 C)    TempSrc:  Axillary    SpO2: 97%  96% 95%  Weight:      Height:       General: Not in acute distress HEENT:       Eyes: PERRL, EOMI, no scleral icterus.       ENT: No discharge from the ears and nose, no pharynx injection, no tonsillar enlargement.        Neck: No JVD, no bruit, no mass felt. Heme: No neck lymph node enlargement. Cardiac: S1/S2, RRR, No murmurs, No gallops or rubs. Respiratory: has rhonchi bilaterally GI: Soft, nondistended, nontender, no rebound pain, no organomegaly, BS present. GU: No hematuria Ext: No pitting leg edema bilaterally. 1+DP/PT pulse bilaterally. Musculoskeletal: No joint deformities, No joint redness or warmth, no limitation of ROM in spin. Skin: No rashes.  Neuro: Alert, oriented X3, cranial nerves II-XII grossly intact, moves all extremities normally.   Psych: Patient is not psychotic, no suicidal or hemocidal ideation.  Labs on Admission: I have personally reviewed following labs and  imaging studies  CBC: Recent Labs  Lab 07/23/21 1051  WBC 11.4*  NEUTROABS 8.7*  HGB 12.9*  HCT 38.3*  MCV 91.0  PLT 856   Basic Metabolic Panel: Recent Labs  Lab 07/23/21 1051  NA 124*  K 3.8  CL 93*  CO2 22  GLUCOSE 60*  BUN 20  CREATININE 1.03  CALCIUM 9.1   GFR: Estimated Creatinine Clearance: 65.1 mL/min (by C-G formula based on SCr of 1.03 mg/dL). Liver Function Tests: Recent Labs  Lab 07/23/21 1051  AST 140*  ALT 54*  ALKPHOS 99  BILITOT 0.9  PROT 7.0  ALBUMIN 2.7*   No results for input(s): LIPASE, AMYLASE in the last 168 hours. No results for input(s): AMMONIA in the last 168 hours. Coagulation Profile: Recent Labs  Lab 07/23/21 1051  INR 1.1   Cardiac Enzymes: No results for input(s): CKTOTAL, CKMB, CKMBINDEX, TROPONINI in the last 168 hours. BNP (last 3 results) No results for input(s):  PROBNP in the last 8760 hours. HbA1C: No results for input(s): HGBA1C in the last 72 hours. CBG: No results for input(s): GLUCAP in the last 168 hours. Lipid Profile: No results for input(s): CHOL, HDL, LDLCALC, TRIG, CHOLHDL, LDLDIRECT in the last 72 hours. Thyroid Function Tests: No results for input(s): TSH, T4TOTAL, FREET4, T3FREE, THYROIDAB in the last 72 hours. Anemia Panel: No results for input(s): VITAMINB12, FOLATE, FERRITIN, TIBC, IRON, RETICCTPCT in the last 72 hours. Urine analysis:    Component Value Date/Time   COLORURINE AMBER (A) 07/23/2021 1051   APPEARANCEUR CLEAR 07/23/2021 1051   APPEARANCEUR Hazy 09/01/2012 1230   LABSPEC 1.015 07/23/2021 1051   LABSPEC 1.015 09/01/2012 1230   PHURINE 6.0 07/23/2021 1051   GLUCOSEU NEGATIVE 07/23/2021 1051   GLUCOSEU Negative 09/01/2012 1230   HGBUR LARGE (A) 07/23/2021 1051   BILIRUBINUR SMALL (A) 07/23/2021 1051   BILIRUBINUR Negative 09/01/2012 1230   KETONESUR 15 (A) 07/23/2021 1051   PROTEINUR 100 (A) 07/23/2021 1051   NITRITE NEGATIVE 07/23/2021 1051   LEUKOCYTESUR NEGATIVE 07/23/2021 1051   LEUKOCYTESUR 1+ 09/01/2012 1230   Sepsis Labs: @LABRCNTIP (procalcitonin:4,lacticidven:4) ) Recent Results (from the past 240 hour(s))  Resp Panel by RT-PCR (Flu A&B, Covid) Nasopharyngeal Swab     Status: None   Collection Time: 07/23/21 10:56 AM   Specimen: Nasopharyngeal Swab; Nasopharyngeal(NP) swabs in vial transport medium  Result Value Ref Range Status   SARS Coronavirus 2 by RT PCR NEGATIVE NEGATIVE Final    Comment: (NOTE) SARS-CoV-2 target nucleic acids are NOT DETECTED.  The SARS-CoV-2 RNA is generally detectable in upper respiratory specimens during the acute phase of infection. The lowest concentration of SARS-CoV-2 viral copies this assay can detect is 138 copies/mL. A negative result does not preclude SARS-Cov-2 infection and should not be used as the sole basis for treatment or other patient management  decisions. A negative result may occur with  improper specimen collection/handling, submission of specimen other than nasopharyngeal swab, presence of viral mutation(s) within the areas targeted by this assay, and inadequate number of viral copies(<138 copies/mL). A negative result must be combined with clinical observations, patient history, and epidemiological information. The expected result is Negative.  Fact Sheet for Patients:  EntrepreneurPulse.com.au  Fact Sheet for Healthcare Providers:  IncredibleEmployment.be  This test is no t yet approved or cleared by the [place] FDA and  has been authorized for detection and/or diagnosis of SARS-CoV-2 by FDA under an Emergency Use Authorization (EUA). This EUA will remain  in effect (meaning this test  can be used) for the duration of the COVID-19 declaration under Section 564(b)(1) of the Act, 21 U.S.C.section 360bbb-3(b)(1), unless the authorization is terminated  or revoked sooner.       Influenza A by PCR NEGATIVE NEGATIVE Final   Influenza B by PCR NEGATIVE NEGATIVE Final    Comment: (NOTE) The Xpert Xpress SARS-CoV-2/FLU/RSV plus assay is intended as an aid in the diagnosis of influenza from Nasopharyngeal swab specimens and should not be used as a sole basis for treatment. Nasal washings and aspirates are unacceptable for Xpert Xpress SARS-CoV-2/FLU/RSV testing.  Fact Sheet for Patients: EntrepreneurPulse.com.au  Fact Sheet for Healthcare Providers: IncredibleEmployment.be  This test is not yet approved or cleared by the [place] FDA and has been authorized for detection and/or diagnosis of SARS-CoV-2 by FDA under an Emergency Use Authorization (EUA). This EUA will remain in effect (meaning this test can be used) for the duration of the COVID-19 declaration under Section 564(b)(1) of the Act, 21 U.S.C. section 360bbb-3(b)(1), unless the  authorization is terminated or revoked.  Performed at [facility], [address] [place], [place] [address]      Radiological Exams on Admission: DG Chest [facility] 1 View  Result Date: 07/23/2021 CLINICAL DATA:  Questionable sepsis. New lung cancer diagnosis with metastasis. Multiple recent falls. EXAM: PORTABLE CHEST 1 VIEW COMPARISON:  None. FINDINGS: Normal cardiac silhouette. Diffuse patchy airspace disease within LEFT and RIGHT lung. Underlying nodularity is less well-defined on the background of airspace disease. No focal consolidation. No pneumothorax. Findings worsened from comparison radiograph. Multiple pulmonary nodules on CT 07/05/2021. IMPRESSION: 1. Worsening pulmonary edema or pulmonary infection pattern. 2. Underlying bilateral pulmonary nodules consistent with pulmonary metastasis. 3. Consider head CT or MRI to exclude brain metastasis with multiple recent falls and new metastatic lung cancer Electronically Signed   By: Suzy Bouchard M.D.   On: 07/23/2021 11:38   CT Renal Stone Study  Result Date: 07/23/2021 CLINICAL DATA:  Flank pain, kidney stone suspected. Code sepsis. Hypotension. EXAM: CT ABDOMEN AND PELVIS WITHOUT CONTRAST TECHNIQUE: Multidetector CT imaging of the abdomen and pelvis was performed following the standard protocol without IV contrast. COMPARISON:  07/05/2021 FINDINGS: Lower chest: Heart is normal size. Coronary artery and aortic calcifications. Numerous bilateral lower lobe pulmonary nodules and masses. The largest is in the left lung base measuring up to 3.4 x 2.9 cm compared with 2.7 x 2.3 cm previously. Many other pulmonary nodules also appear enlarged since prior study. Trace bilateral pleural effusions. Hepatobiliary: Prior cholecystectomy. Small hypodensity in the left hepatic lobe likely reflects cyst. Prior cholecystectomy. Pancreas: No focal abnormality or ductal dilatation. Spleen: No focal abnormality.  Normal size. Adrenals/Urinary Tract:  Bilateral adrenal masses are unchanged. Infiltrative appearing masses in both kidneys again noted, appear unchanged. Bilateral nephrolithiasis. No ureteral stones or hydronephrosis. Small layering stones dependently within the urinary bladder. Bladder wall is unremarkable. Stomach/Bowel: No evidence of bowel obstruction. Vascular/Lymphatic: Aortoiliac atherosclerosis. Retroperitoneal adenopathy is stable. Reproductive: No visible focal abnormality. Other: Extensive peritoneal nodularity/disease and omental nodularity. This has progressed since prior study. Index left peritoneal nodule measures up to 3.7 cm compared to 3.2 cm previously. Other peritoneal/omental nodules appear larger. Musculoskeletal: Destructive left acetabular lesion an ill-defined left inferior pubic ramus lesion again noted, unchanged. IMPRESSION: Extensive metastatic disease in the chest, abdomen and bony pelvis as described above. This appears progressive with enlarging pulmonary nodules and omental/peritoneal nodules. Infiltrating masses within both kidneys are difficult to visualize without IV contrast, but likely similar to prior  study. Bilateral nephrolithiasis. Small layering urinary bladder stones. No ureteral stones or hydronephrosis. Trace bilateral pleural effusions. Aortic atherosclerosis, coronary artery disease. Electronically Signed   By: Rolm Baptise M.D.   On: 07/23/2021 13:32     EKG: I have personally reviewed.  Sinus rhythm, QTC 473, low voltage, LAD   Assessment/Plan Principal Problem:   COPD exacerbation (HCC) Active Problems:   Coronary artery disease   Severe sepsis with septic shock (HCC)   HCAP (healthcare-associated pneumonia)   Lung cancer (HCC)   Hypoglycemia   Lupus (HCC)   Hyponatremia   Diarrhea   Dementia (HCC)   Tobacco abuse    Severe sepsis with septic shock due to COPD exacerbation and possible HCAP: Patient has productive cough, shortness breath, rhonchi on auscultation, clinically  consistent with COPD exacerbation.  Patient has lung cancer, chest x-ray cannot completely rule out pneumonia.  Will treat patient as HCAP.  Patient has severe sepsis with septic shock.  Blood pressure 78/45, tachycardia with heart rate 99, RR 24.  Lactic acid 2.3.  Levophed was started.  Dr. Patsey Berthold of PCCM is consulted.  -Admitted to SDU as inpatient - continue levophed gtt - pt was given 100 mg of soluCortef in ED - IV Vancomycin and cefepime (patient received 1 dose of Rocephin and azithromycin in ED) - Solu-Medrol 40 mg twice daily - Mucinex for cough  - Incentive spirometry - Bronchodilators - Urine legionella and S. pneumococcal antigen - Follow up blood culture x2, Ux and sputum culture - will get Procalcitonin and trend lactic acid level per sepsis protocol - IVF: 2L of LR bolus in ED, followed by 75 mL per hour of NS  Coronary artery disease: no chest pain -Observe closely  Lung cancer ([place]): recently diagnosed with metastasized lung cancer, with diffuse metastasis. -Dr. Patsey Berthold of PCCM have sent a message to Dr. Rogue Bussing of oncology for consultation -Palliative care consult  Diarrhea: -IV fluid as above -Check C. difficile and GI pathogen panel  Hypoglycemia: Blood sugar 60 on BMP -Check CBG every hour x3 -D50 as needed  Lupus ([place]) -Patient on Solu-Medrol for COPD exacerbation -Hold Plaquenil due to ongoing infection and severe sepsis  Hyponatremia: Na 124, likely due to diarrhea and decreased oral intake - Will check urine sodium, urine osmolality, serum osmolality. - IVF: as above - f/u by BMP q8h - avoid over correction too fast due to risk of central pontine myelinolysis  Dementia -Donepezil  Tobacco abuse: -Nicotine patch      DVT ppx:  SQ Lovenox Code Status: DNR per pt and family Family Communication: Yes, patient's wife, sister, daughter   at bed side.   Disposition Plan:  Anticipate discharge back to previous environment Consults called:  Dr. Patsey Berthold of PCCM Admission status and Level of care: Stepdown:    SDU/inpation       Status is: Inpatient  Remains inpatient appropriate           Date of Service 07/23/2021    [facility]   If 7PM-7AM, please contact night-coverage [email or web address] 07/23/2021, 6:37 PM

## 2021-07-24 ENCOUNTER — Inpatient Hospital Stay: Payer: Medicare HMO

## 2021-07-24 ENCOUNTER — Encounter: Payer: Self-pay | Admitting: Internal Medicine

## 2021-07-24 ENCOUNTER — Ambulatory Visit: Admission: RE | Admit: 2021-07-24 | Payer: Medicare HMO | Source: Ambulatory Visit

## 2021-07-24 DIAGNOSIS — J441 Chronic obstructive pulmonary disease with (acute) exacerbation: Secondary | ICD-10-CM

## 2021-07-24 LAB — BASIC METABOLIC PANEL
Anion gap: 9 (ref 5–15)
BUN: 21 mg/dL (ref 8–23)
CO2: 22 mmol/L (ref 22–32)
Calcium: 8.4 mg/dL — ABNORMAL LOW (ref 8.9–10.3)
Chloride: 98 mmol/L (ref 98–111)
Creatinine, Ser: 1.03 mg/dL (ref 0.61–1.24)
GFR, Estimated: >60 mL/min (ref >60)
Glucose, Bld: 151 mg/dL — ABNORMAL HIGH (ref 70–99)
Potassium: 4.5 mmol/L (ref 3.5–5.1)
Sodium: 129 mmol/L — ABNORMAL LOW (ref 135–145)

## 2021-07-24 LAB — URINE CULTURE: Culture: NO GROWTH

## 2021-07-24 LAB — CBC
HCT: 36 % — ABNORMAL LOW (ref 39.0–52.0)
Hemoglobin: 12 g/dL — ABNORMAL LOW (ref 13.0–17.0)
MCH: 30.8 pg (ref 26.0–34.0)
MCHC: 33.3 g/dL (ref 30.0–36.0)
MCV: 92.3 fL (ref 80.0–100.0)
Platelets: 202 10*3/uL (ref 150–400)
RBC: 3.9 MIL/uL — ABNORMAL LOW (ref 4.22–5.81)
RDW: 15.3 % (ref 11.5–15.5)
WBC: 14.1 10*3/uL — ABNORMAL HIGH (ref 4.0–10.5)
nRBC: 0 % (ref 0.0–0.2)

## 2021-07-24 LAB — LEGIONELLA PNEUMOPHILA SEROGP 1 UR AG: L. pneumophila Serogp 1 Ur Ag: NEGATIVE

## 2021-07-24 LAB — PROCALCITONIN: Procalcitonin: 1.21 ng/mL

## 2021-07-24 LAB — CBG MONITORING, ED: Glucose-Capillary: 204 mg/dL — ABNORMAL HIGH (ref 70–99)

## 2021-07-24 LAB — LACTIC ACID, PLASMA: Lactic Acid, Venous: 1.8 mmol/L (ref 0.5–1.9)

## 2021-07-24 LAB — OSMOLALITY: Osmolality: 287 mOsm/kg (ref 275–295)

## 2021-07-24 MED ORDER — GADOBUTROL 1 MMOL/ML IV SOLN
7.5000 mL | Freq: Once | INTRAVENOUS | Status: AC | PRN
  Administered 2021-07-24: 11:00:00 7.5 mL via INTRAVENOUS

## 2021-07-24 MED ORDER — VANCOMYCIN HCL 2000 MG/400ML IV SOLN
2000.0000 mg | INTRAVENOUS | Status: DC
Start: 2021-07-24 — End: 2021-07-25
  Administered 2021-07-24: 14:00:00 2000 mg via INTRAVENOUS
  Filled 2021-07-24 (×2): qty 400

## 2021-07-24 NOTE — Progress Notes (Signed)
PROGRESS NOTE    Frederick Mitchell  LNL:[id number] DOB: 09/02/1955 DOA: 07/23/2021 PCP: Gayland Curry, MD    Brief Narrative:   65 y.o. male with medical history significant of deaf, hyperlipidemia, COPD not on oxygen at home, GERD, CAD, kidney stone, lupus, dementia, recently diagnosed metastasized to lung cancer, who presents with shortness of breath.   Patient is deaf.  History is obtained with help of iPad Sign Language interpreter.  Patient states that in the past several days he has generalized weakness, malaise, cough, shortness of breath, fever and chills.  Patient had fever 100.4 at home.  He coughs up brownish colored sputum.  Denies chest pain.  Patient has had several episodes of diarrhea.  He has nausea, no vomiting or abdominal pain.  Patient has poor appetite and decreased oral intake.  He also reports dysuria and burning on urination.    Patient was found to have hypotension with 78/45.  Levophed drip is started in the ED. he was responding to Levophed.  ICU, oncology, palliative care have all been consulted.  MRI brain ordered and was positive for cerebral metastases.   Assessment & Plan:   Principal Problem:   COPD exacerbation ([place]) Active Problems:   Coronary artery disease   Severe sepsis with septic shock (HCC)   HCAP (healthcare-associated pneumonia)   Lung cancer ([place])   Hypoglycemia   Lupus ([place])   Hyponatremia   Diarrhea   Dementia (HCC)   Tobacco abuse  Severe sepsis with septic shock due to COPD exacerbation and possible HCAP Patient has productive cough, shortness breath, rhonchi on auscultation, clinically consistent with COPD exacerbation.   Patient has lung cancer, chest x-ray cannot completely rule out pneumonia.   Will treat patient as HCAP.   Patient has severe sepsis with septic shock.   Blood pressure 78/45, tachycardia with heart rate 99, RR 24.  Lactic acid 2.3. Levophed was started.  Dr. Patsey Berthold of PCCM is consulted. Plan: Continue  stepdown status Continue Levophed drip Continue vancomycin and cefepime, started 12/26 IV Solu-Medrol 40 every 12 Bronchodilators Mucolytic's Antitussives Follow urine Legionella and streptococcal antigen Follow blood urine and sputum culture IV fluids   Coronary artery disease  no chest pain -Observe closely   Lung cancer ([place])  recently diagnosed with metastasized lung cancer, with diffuse metastasis. -Oncology Dr. Rogue Bussing consulted -Palliative care service consulted -MRI brain.  Positive for cerebral metastasis with mild vasogenic edema -Poor prognosis.  DNR status -Appropriate for transition to comfort measure approach   Diarrhea: -IV fluid as above -Check C. difficile and GI pathogen panel, pending   Hypoglycemia: Blood sugar 60 on BMP -Check CBG every hour x3 -D50 as needed   Lupus (HCC) -Patient on Solu-Medrol for COPD exacerbation -Hold Plaquenil due to ongoing infection and severe sepsis   Hyponatremia  Na 124, likely due to diarrhea and decreased oral intake -IV fluids   Dementia -Donepezil   Tobacco abuse: -Nicotine patch   DVT prophylaxis: SQ Lovenox Code Status: DNR Family Communication: Patient's wife and sister at bedside 12/27 Disposition Plan: Status is: Inpatient  Remains inpatient appropriate because: Septic shock.  Lung cancer with diffuse metastasis.  Poor prognosis.       Level of care: Stepdown  Consultants:  PCCM Oncology Palliative care  Procedures:  None  Antimicrobials: Vancomycin Cefepime   Subjective: Seen and examined.  History limited by deafness.  Does endorse feeling a little better since admission.  Objective: Vitals:   07/24/21 0915 07/24/21 0930 07/24/21 0945 07/24/21 1200  BP: 133/68 (!) 119/59 121/61 (!) 121/57  Pulse: 92 84 86 80  Resp: (!) 24 20 (!) 23 18  Temp:      TempSrc:      SpO2: 96% 97% 99% 97%  Weight:      Height:        Intake/Output Summary (Last 24 hours) at 07/24/2021  1320 Last data filed at 07/24/2021 0259 Gross per 24 hour  Intake 1200 ml  Output 300 ml  Net 900 ml   Filed Weights   07/23/21 1047  Weight: 75 kg    Examination:  General exam: No acute distress Respiratory system: Coarse breath sounds bilaterally Cardiovascular system: Tachycardic, regular rhythm, no murmurs, no pedal edema Gastrointestinal system: Soft, NT/ND, normal bowel sounds Central nervous system: Alert and oriented. No focal neurological deficits. Extremities: Decreased power symmetrically Skin: Pale no obvious skin rashes or lesions Psychiatry: Judgement and insight appear normal. Mood & affect appropriate.     Data Reviewed: I have personally reviewed following labs and imaging studies  CBC: Recent Labs  Lab 07/23/21 1051 07/24/21 0233  WBC 11.4* 14.1*  NEUTROABS 8.7*  --   HGB 12.9* 12.0*  HCT 38.3* 36.0*  MCV 91.0 92.3  PLT 203 374   Basic Metabolic Panel: Recent Labs  Lab 07/23/21 1051 07/24/21 0233 07/24/21 0922  NA 124* 129* 139  K 3.8 4.5 4.0  CL 93* 98 106  CO2 22 22 16*  GLUCOSE 60* 151* 195*  BUN 20 21 17   CREATININE 1.03 1.03 0.56*  CALCIUM 9.1 8.4* 6.6*   GFR: Estimated Creatinine Clearance: 83.8 mL/min (A) (by C-G formula based on SCr of 0.56 mg/dL (L)). Liver Function Tests: Recent Labs  Lab 07/23/21 1051  AST 140*  ALT 54*  ALKPHOS 99  BILITOT 0.9  PROT 7.0  ALBUMIN 2.7*   No results for input(s): LIPASE, AMYLASE in the last 168 hours. No results for input(s): AMMONIA in the last 168 hours. Coagulation Profile: Recent Labs  Lab 07/23/21 1051  INR 1.1   Cardiac Enzymes: No results for input(s): CKTOTAL, CKMB, CKMBINDEX, TROPONINI in the last 168 hours. BNP (last 3 results) No results for input(s): PROBNP in the last 8760 hours. HbA1C: No results for input(s): HGBA1C in the last 72 hours. CBG: Recent Labs  Lab 07/24/21 0812  GLUCAP 204*   Lipid Profile: No results for input(s): CHOL, HDL, LDLCALC, TRIG,  CHOLHDL, LDLDIRECT in the last 72 hours. Thyroid Function Tests: No results for input(s): TSH, T4TOTAL, FREET4, T3FREE, THYROIDAB in the last 72 hours. Anemia Panel: No results for input(s): VITAMINB12, FOLATE, FERRITIN, TIBC, IRON, RETICCTPCT in the last 72 hours. Sepsis Labs: Recent Labs  Lab 07/23/21 1051 07/23/21 1222 07/23/21 1853 07/24/21 0233 07/24/21 0654  PROCALCITON  --   --   --   --  1.21  LATICACIDVEN 2.3* 2.0* 1.5 1.8  --     Recent Results (from the past 240 hour(s))  Blood Culture (routine x 2)     Status: None (Preliminary result)   Collection Time: 07/23/21 10:51 AM   Specimen: BLOOD  Result Value Ref Range Status   Specimen Description BLOOD RIGHT ANTECUBITAL  Final   Special Requests   Final    BOTTLES DRAWN AEROBIC AND ANAEROBIC Blood Culture adequate volume   Culture   Final    NO GROWTH < 24 HOURS Performed at [facility], [address], [place], [place] [address]    Report Status PENDING  Incomplete  Blood Culture (  routine x 2)     Status: None (Preliminary result)   Collection Time: 07/23/21 10:53 AM   Specimen: BLOOD  Result Value Ref Range Status   Specimen Description BLOOD LEFT ANTECUBITAL  Final   Special Requests   Final    BOTTLES DRAWN AEROBIC AND ANAEROBIC Blood Culture adequate volume   Culture  Setup Time   Final    GRAM POSITIVE RODS CRITICAL RESULT CALLED TO, READ BACK BY AND VERIFIED WITH: NATHAN BELUE @ 1610 ON 07/23/21.Marland KitchenMarland KitchenTKR IN BOTH AEROBIC AND ANAEROBIC BOTTLES Performed at [facility], [address] [place], [place] [address]    Culture GRAM POSITIVE RODS  Final   Report Status PENDING  Incomplete  Resp Panel by RT-PCR (Flu A&B, Covid) Nasopharyngeal Swab     Status: None   Collection Time: 07/23/21 10:56 AM   Specimen: Nasopharyngeal Swab; Nasopharyngeal(NP) swabs in vial transport medium  Result Value Ref Range Status   SARS Coronavirus 2 by RT PCR NEGATIVE NEGATIVE Final    Comment:  (NOTE) SARS-CoV-2 target nucleic acids are NOT DETECTED.  The SARS-CoV-2 RNA is generally detectable in upper respiratory specimens during the acute phase of infection. The lowest concentration of SARS-CoV-2 viral copies this assay can detect is 138 copies/mL. A negative result does not preclude SARS-Cov-2 infection and should not be used as the sole basis for treatment or other patient management decisions. A negative result may occur with  improper specimen collection/handling, submission of specimen other than nasopharyngeal swab, presence of viral mutation(s) within the areas targeted by this assay, and inadequate number of viral copies(<138 copies/mL). A negative result must be combined with clinical observations, patient history, and epidemiological information. The expected result is Negative.  Fact Sheet for Patients:  EntrepreneurPulse.com.au  Fact Sheet for Healthcare Providers:  IncredibleEmployment.be  This test is no t yet approved or cleared by the [place] FDA and  has been authorized for detection and/or diagnosis of SARS-CoV-2 by FDA under an Emergency Use Authorization (EUA). This EUA will remain  in effect (meaning this test can be used) for the duration of the COVID-19 declaration under Section 564(b)(1) of the Act, 21 U.S.C.section 360bbb-3(b)(1), unless the authorization is terminated  or revoked sooner.       Influenza A by PCR NEGATIVE NEGATIVE Final   Influenza B by PCR NEGATIVE NEGATIVE Final    Comment: (NOTE) The Xpert Xpress SARS-CoV-2/FLU/RSV plus assay is intended as an aid in the diagnosis of influenza from Nasopharyngeal swab specimens and should not be used as a sole basis for treatment. Nasal washings and aspirates are unacceptable for Xpert Xpress SARS-CoV-2/FLU/RSV testing.  Fact Sheet for Patients: EntrepreneurPulse.com.au  Fact Sheet for Healthcare  Providers: IncredibleEmployment.be  This test is not yet approved or cleared by the [place] FDA and has been authorized for detection and/or diagnosis of SARS-CoV-2 by FDA under an Emergency Use Authorization (EUA). This EUA will remain in effect (meaning this test can be used) for the duration of the COVID-19 declaration under Section 564(b)(1) of the Act, 21 U.S.C. section 360bbb-3(b)(1), unless the authorization is terminated or revoked.  Performed at [facility], [address], [place], [place] [address]          Radiology Studies: MR BRAIN W WO CONTRAST  Result Date: 07/24/2021 CLINICAL DATA:  Lung cancer staging EXAM: MRI HEAD WITHOUT AND WITH CONTRAST TECHNIQUE: Multiplanar, multiecho pulse sequences of the brain and surrounding structures were obtained without and with intravenous contrast. CONTRAST:  7.54mL GADAVIST  GADOBUTROL 1 MMOL/ML IV SOLN COMPARISON:  None recent or similar FINDINGS: Brain: 9 mm cortically based mass in the left occipital lobe with mild vasogenic edema. Two adjacent cortically based lesions in the left frontal parietal region measuring 2 cm more anteriorly and 8 mm more posteriorly. Moderate vasogenic edema in the high left cerebrum. The largest lesion has chronic blood products. Asymmetric somewhat nodular thickening in the choroid plexus at the right temporal lobe measuring 5 mm, indeterminate. Ill-defined thickening of the pituitary gland for age, measuring 11 mm craniocaudal. No acute infarct, hemorrhage, hydrocephalus, or collection. Vascular: Normal flow voids and vessel enhancements. Skull and upper cervical spine: Normal marrow signal Sinuses/Orbits: Negative IMPRESSION: Positive for cerebral metastatic disease with at least 3 lesions seen in the left cerebrum, measuring up to 2 cm at the frontal parietal junction where there is moderate vasogenic edema. Pituitary thickening and nodular thickening at the right choroid  plexus could also be from metastatic disease, attention on follow-up. Electronically Signed   By: Jorje Guild M.D.   On: 07/24/2021 10:56   DG Chest Port 1 View  Result Date: 07/23/2021 CLINICAL DATA:  Questionable sepsis. New lung cancer diagnosis with metastasis. Multiple recent falls. EXAM: PORTABLE CHEST 1 VIEW COMPARISON:  None. FINDINGS: Normal cardiac silhouette. Diffuse patchy airspace disease within LEFT and RIGHT lung. Underlying nodularity is less well-defined on the background of airspace disease. No focal consolidation. No pneumothorax. Findings worsened from comparison radiograph. Multiple pulmonary nodules on CT 07/05/2021. IMPRESSION: 1. Worsening pulmonary edema or pulmonary infection pattern. 2. Underlying bilateral pulmonary nodules consistent with pulmonary metastasis. 3. Consider head CT or MRI to exclude brain metastasis with multiple recent falls and new metastatic lung cancer Electronically Signed   By: Suzy Bouchard M.D.   On: 07/23/2021 11:38   CT Renal Stone Study  Result Date: 07/23/2021 CLINICAL DATA:  Flank pain, kidney stone suspected. Code sepsis. Hypotension. EXAM: CT ABDOMEN AND PELVIS WITHOUT CONTRAST TECHNIQUE: Multidetector CT imaging of the abdomen and pelvis was performed following the standard protocol without IV contrast. COMPARISON:  07/05/2021 FINDINGS: Lower chest: Heart is normal size. Coronary artery and aortic calcifications. Numerous bilateral lower lobe pulmonary nodules and masses. The largest is in the left lung base measuring up to 3.4 x 2.9 cm compared with 2.7 x 2.3 cm previously. Many other pulmonary nodules also appear enlarged since prior study. Trace bilateral pleural effusions. Hepatobiliary: Prior cholecystectomy. Small hypodensity in the left hepatic lobe likely reflects cyst. Prior cholecystectomy. Pancreas: No focal abnormality or ductal dilatation. Spleen: No focal abnormality.  Normal size. Adrenals/Urinary Tract: Bilateral adrenal  masses are unchanged. Infiltrative appearing masses in both kidneys again noted, appear unchanged. Bilateral nephrolithiasis. No ureteral stones or hydronephrosis. Small layering stones dependently within the urinary bladder. Bladder wall is unremarkable. Stomach/Bowel: No evidence of bowel obstruction. Vascular/Lymphatic: Aortoiliac atherosclerosis. Retroperitoneal adenopathy is stable. Reproductive: No visible focal abnormality. Other: Extensive peritoneal nodularity/disease and omental nodularity. This has progressed since prior study. Index left peritoneal nodule measures up to 3.7 cm compared to 3.2 cm previously. Other peritoneal/omental nodules appear larger. Musculoskeletal: Destructive left acetabular lesion an ill-defined left inferior pubic ramus lesion again noted, unchanged. IMPRESSION: Extensive metastatic disease in the chest, abdomen and bony pelvis as described above. This appears progressive with enlarging pulmonary nodules and omental/peritoneal nodules. Infiltrating masses within both kidneys are difficult to visualize without IV contrast, but likely similar to prior study. Bilateral nephrolithiasis. Small layering urinary bladder stones. No ureteral stones or hydronephrosis. Trace bilateral pleural effusions.  Aortic atherosclerosis, coronary artery disease. Electronically Signed   By: Rolm Baptise M.D.   On: 07/23/2021 13:32        Scheduled Meds:  doxepin  50 mg Oral QHS   enoxaparin (LOVENOX) injection  40 mg Subcutaneous Q24H   ipratropium-albuterol  3 mL Nebulization Q4H   methylPREDNISolone (SOLU-MEDROL) injection  40 mg Intravenous Q12H   nicotine  21 mg Transdermal Daily   Continuous Infusions:  sodium chloride 75 mL/hr at 07/24/21 1201   ceFEPime (MAXIPIME) IV Stopped (07/24/21 1030)   norepinephrine (LEVOPHED) Adult infusion 6 mcg/min (07/24/21 1202)   vancomycin       LOS: 1 day    Time spent: 35 minutes    Sidney Ace, MD [facility]   If  7PM-7AM, please contact night-coverage  07/24/2021, 1:20 PM

## 2021-07-24 NOTE — ED Notes (Signed)
This RN attempted to call report to ICU for this pt. They requested this RN call back in 15 minutes.

## 2021-07-24 NOTE — Consult Note (Signed)
[facility] NOTE  Patient Care Team: Gayland Curry, MD as PCP - General (Family Medicine) Telford Nab, RN as Oncology Nurse Navigator  CHIEF COMPLAINTS/PURPOSE OF CONSULTATION:  Lung cancer/sepsis  HISTORY OF PRESENTING ILLNESS: Patient accompanied by wife; 2 sisters.  Interpreted by sign language interpreter.  Frederick Mitchell 65 y.o.  male with medical history significant for deaf and also COPD CAD and also recently diagnosed metastatic lung cancer is admitted to hospital for worsening shortness of breath.  Patient also noted to have low-grade fever of 100.4 along with cough generalized weakness/fatigue.  Chest x-ray showed worsening pulmonary infection with underlying bilateral pulmonary nodules.  Given patient's multiple falls-patient had MRI of the brain that showed at least 3 lesions with significant edema.  On admission patient noted to have hypotension with systolic blood pressure in 70s.  Patient started on Levophed drip in the emergency room.  Patient was evaluated by pulmonary.  Oncology has been consulted for further recommendations and plan of care.  Patient also started on steroids/Solu-Medrol given hypotension.   Review of Systems  Constitutional:  Positive for malaise/fatigue and weight loss. Negative for chills, diaphoresis and fever.  HENT:  Negative for nosebleeds and sore throat.   Eyes:  Negative for double vision.  Respiratory:  Positive for cough, sputum production and shortness of breath. Negative for hemoptysis and wheezing.   Cardiovascular:  Negative for chest pain, palpitations, orthopnea and leg swelling.  Gastrointestinal:  Negative for abdominal pain, blood in stool, constipation, diarrhea, heartburn, melena, nausea and vomiting.  Genitourinary:  Negative for dysuria, frequency and urgency.  Musculoskeletal:  Positive for back pain and joint pain.  Skin: Negative.  Negative for itching and rash.  Neurological:  Positive for dizziness.  Negative for tingling, focal weakness, weakness and headaches.  Endo/Heme/Allergies:  Does not bruise/bleed easily.  Psychiatric/Behavioral:  Negative for depression. The patient is not nervous/anxious and does not have insomnia.     MEDICAL HISTORY:  Past Medical History:  Diagnosis Date   COPD (chronic obstructive pulmonary disease) ([place])    Deaf    Hypercholesteremia unk    SURGICAL HISTORY: Past Surgical History:  Procedure Laterality Date   EYE SURGERY      SOCIAL HISTORY: Social History   Socioeconomic History   Marital status: Married    Spouse name: Not on file   Number of children: Not on file   Years of education: Not on file   Highest education level: Not on file  Occupational History   Not on file  Tobacco Use   Smoking status: Every Day    Types: Cigarettes   Smokeless tobacco: Never  Substance and Sexual Activity   Alcohol use: No   Drug use: Not Currently   Sexual activity: Not Currently    Birth control/protection: None  Other Topics Concern   Not on file  Social History Narrative   Not on file   Social Determinants of Health   Financial Resource Strain: Not on file  Food Insecurity: Not on file  Transportation Needs: Not on file  Physical Activity: Not on file  Stress: Not on file  Social Connections: Not on file  Intimate Partner Violence: Not on file    FAMILY HISTORY: Family History  Problem Relation Age of Onset   Lung cancer Father 69   Lung cancer Brother 53   COPD Maternal Grandmother 84   Heart failure Maternal Grandfather 72   Lung cancer Paternal Grandfather 36   Brain cancer Paternal  Uncle 40   Lung cancer Paternal Uncle 53       possible lung cancer with metastatic disease. not sure of origin.   Lung cancer Paternal Uncle 97    ALLERGIES:  has No Known Allergies.  MEDICATIONS:  Current Facility-Administered Medications  Medication Dose Route Frequency Provider Last Rate Last Admin   0.9 %  sodium chloride infusion    Intravenous Continuous Ivor Costa, MD 75 mL/hr at 07/24/21 1201 Rate Verify at 07/24/21 1201   acetaminophen (TYLENOL) tablet 650 mg  650 mg Oral Q6H PRN Ivor Costa, MD       albuterol (PROVENTIL) (2.5 MG/3ML) 0.083% nebulizer solution 2.5 mg  2.5 mg Nebulization Q4H PRN Ivor Costa, MD       ceFEPIme (MAXIPIME) 2 g in sodium chloride 0.9 % 100 mL IVPB  2 g Intravenous Q8H Rauer, Forde Dandy, RPH   Stopped at 07/24/21 1851   dextromethorphan-guaiFENesin ([place] DM) 30-600 MG per 12 hr tablet 1 tablet  1 tablet Oral BID PRN Ivor Costa, MD       dextrose 50 % solution 50 mL  50 mL Intravenous PRN Ivor Costa, MD       doxepin (SINEQUAN) capsule 50 mg  50 mg Oral QHS Ivor Costa, MD   50 mg at 07/24/21 0229   enoxaparin (LOVENOX) injection 40 mg  40 mg Subcutaneous Q24H Ivor Costa, MD       hydrOXYzine (ATARAX) tablet 10 mg  10 mg Oral TID PRN Ivor Costa, MD       ipratropium-albuterol (DUONEB) 0.5-2.5 (3) MG/3ML nebulizer solution 3 mL  3 mL Nebulization Q4H Ivor Costa, MD   3 mL at 07/24/21 1549   methylPREDNISolone sodium succinate (SOLU-MEDROL) 40 mg/mL injection 40 mg  40 mg Intravenous Q12H Ivor Costa, MD   40 mg at 07/24/21 1815   nicotine (NICODERM CQ - dosed in mg/24 hours) patch 21 mg  21 mg Transdermal Daily Ivor Costa, MD       norepinephrine (LEVOPHED) 4mg  in 268mL (0.016 mg/mL) premix infusion  0-40 mcg/min Intravenous Continuous Ivor Costa, MD 22.5 mL/hr at 07/24/21 1422 6 mcg/min at 07/24/21 1422   ondansetron (ZOFRAN) injection 4 mg  4 mg Intravenous Q8H PRN Ivor Costa, MD       vancomycin (VANCOREADY) IVPB 2000 mg/400 mL  2,000 mg Intravenous Q24H Rauer, Forde Dandy, RPH   Stopped at 07/24/21 1550   Current Outpatient Medications  Medication Sig Dispense Refill   doxepin (SINEQUAN) 50 MG capsule Take 50 mg by mouth at bedtime.     fluticasone (FLONASE) 50 MCG/ACT nasal spray SHAKE LIQUID AND USE 2 SPRAYS IN EACH NOSTRIL EVERY DAY     Fluticasone-Umeclidin-Vilant (TRELEGY ELLIPTA)  100-62.5-25 MCG/ACT AEPB Inhale 2 sprays into the lungs 2 (two) times daily.     hydroxychloroquine (PLAQUENIL) 200 MG tablet Take 200 mg by mouth 2 (two) times daily.     hydrOXYzine (ATARAX) 10 MG tablet Take 10 mg by mouth 3 (three) times daily as needed.     Vitamin D, Ergocalciferol, (DRISDOL) 1.25 MG (50000 UNIT) CAPS capsule Take 50,000 Units by mouth 3 (three) times a week.     carboxymethylcellulose (REFRESH PLUS) 0.5 % SOLN Apply to eye. (Patient not taking: Reported on 07/23/2021)     clobetasol (TEMOVATE) 0.05 % external solution Apply 1 application topically 2 (two) times daily. (Patient not taking: Reported on 07/23/2021)     nicotine (NICODERM CQ - DOSED IN MG/24 HOURS) 21 mg/24hr patch Place 1  patch (21 mg total) onto the skin daily for 14 days. (Patient not taking: Reported on 07/23/2021) 14 patch 0   pantoprazole (PROTONIX) 40 MG tablet Take 1 tablet (40 mg total) by mouth daily. (Patient not taking: Reported on 07/23/2021) 30 tablet 0   predniSONE (DELTASONE) 10 MG tablet Take 10 mg by mouth daily. (Patient not taking: Reported on 07/23/2021)     triamcinolone (KENALOG) 0.025 % cream Apply 1 application topically 3 (three) times daily. (Patient not taking: Reported on 07/23/2021)        .  PHYSICAL EXAMINATION:  Vitals:   07/24/21 1930 07/24/21 2000  BP: 131/70 134/68  Pulse: 81 77  Resp: (!) 24 (!) 21  Temp:    SpO2: 95% 95%   Filed Weights   07/23/21 1047  Weight: 165 lb 5.5 oz (75 kg)    Physical Exam   LABORATORY DATA:  I have reviewed the data as listed Lab Results  Component Value Date   WBC 14.1 (H) 07/24/2021   HGB 12.0 (L) 07/24/2021   HCT 36.0 (L) 07/24/2021   MCV 92.3 07/24/2021   PLT 202 07/24/2021   Recent Labs    07/05/21 1633 07/06/21 1421 07/09/21 0243 07/23/21 1051 07/24/21 0233 07/24/21 0922  NA 131* 131*   < > 124* 129* 139  K 3.8 3.9  --  3.8 4.5 4.0  CL 96* 98  --  93* 98 106  CO2 17* 26  --  22 22 16*  GLUCOSE 69* 80  --   60* 151* 195*  BUN 16 14  --  20 21 17   CREATININE 0.52* 0.60*  --  1.03 1.03 0.56*  CALCIUM 9.6 8.9  --  9.1 8.4* 6.6*  GFRNONAA >60 >60  --  >60 >60 >60  PROT 7.1 6.6  --  7.0  --   --   ALBUMIN 3.4* 2.9*  --  2.7*  --   --   AST 93* 80*  --  140*  --   --   ALT 64* 55*  --  54*  --   --   ALKPHOS 73 69  --  99  --   --   BILITOT 0.6 0.7  --  0.9  --   --    < > = values in this interval not displayed.    RADIOGRAPHIC STUDIES: I have personally reviewed the radiological images as listed and agreed with the findings in the report. MR BRAIN W WO CONTRAST  Result Date: 07/24/2021 CLINICAL DATA:  Lung cancer staging EXAM: MRI HEAD WITHOUT AND WITH CONTRAST TECHNIQUE: Multiplanar, multiecho pulse sequences of the brain and surrounding structures were obtained without and with intravenous contrast. CONTRAST:  7.44mL GADAVIST GADOBUTROL 1 MMOL/ML IV SOLN COMPARISON:  None recent or similar FINDINGS: Brain: 9 mm cortically based mass in the left occipital lobe with mild vasogenic edema. Two adjacent cortically based lesions in the left frontal parietal region measuring 2 cm more anteriorly and 8 mm more posteriorly. Moderate vasogenic edema in the high left cerebrum. The largest lesion has chronic blood products. Asymmetric somewhat nodular thickening in the choroid plexus at the right temporal lobe measuring 5 mm, indeterminate. Ill-defined thickening of the pituitary gland for age, measuring 11 mm craniocaudal. No acute infarct, hemorrhage, hydrocephalus, or collection. Vascular: Normal flow voids and vessel enhancements. Skull and upper cervical spine: Normal marrow signal Sinuses/Orbits: Negative IMPRESSION: Positive for cerebral metastatic disease with at least 3 lesions seen in the left cerebrum, measuring  up to 2 cm at the frontal parietal junction where there is moderate vasogenic edema. Pituitary thickening and nodular thickening at the right choroid plexus could also be from metastatic disease,  attention on follow-up. Electronically Signed   By: Jorje Guild M.D.   On: 07/24/2021 10:56   CT CHEST ABDOMEN PELVIS W CONTRAST  Result Date: 07/05/2021 CLINICAL DATA:  [name] presents for follow-up of suspected pulmonary neoplasm. EXAM: CT CHEST, ABDOMEN, AND PELVIS WITH CONTRAST TECHNIQUE: Multidetector CT imaging of the chest, abdomen and pelvis was performed following the standard protocol during bolus administration of intravenous contrast. CONTRAST:  131mL OMNIPAQUE IOHEXOL 300 MG/ML  SOLN COMPARISON:  Most recent comparison imaging of the chest are more remote chest x-rays. No direct comparison imaging is available with CT. Report from [place] center references a 1.6 x 1.00 cm pulmonary nodule in the LEFT upper lobe. FINDINGS: CT CHEST FINDINGS Cardiovascular: Calcified and noncalcified atheromatous plaque of the thoracic aorta. Normal heart size. Three-vessel coronary artery disease. Mild engorgement of central pulmonary vasculature. Mediastinum/Nodes: Diffuse adenopathy throughout the mediastinum with heterogeneous and necrotic appearance. LEFT paratracheal mass 3.8 x 3.3 cm (image 26/2) AP window lymph node 2.3 cm short axis. RIGHT paratracheal/peribronchial lymph node (image 30/2) 16 mm short axis. Cystic lesion in the anterior mediastinum reported as benign on previous imaging, no comparison available. Pre-vascular lymph node adjacent to a LEFT upper lobe mass (image 20/2) pre-vascular lymph node at 13 mm. No thoracic inlet adenopathy. Is no axillary adenopathy. Lungs/Pleura: Numerous pulmonary nodules, too numerous to count. Largest in the LEFT upper lobe invading mediastinal fat measuring 3.6 x 3.0 cm, reportedly 1.6 x 1.0 cm on previous imaging assuming this is the same nodule. Central mass, more likely LEFT juxta hilar suprahilar adenopathy 3.0 x 3.4 cm (image 69/3) LEFT upper lobe pulmonary nodule 11 mm (image 73/3) Numerous additional pulmonary nodules on the  LEFT and RIGHT. Largest in the LEFT lower lobe 2.6 x 2.3 cm. Largest in the RIGHT lower lobe (image 132/3) 2.1 cm. Airways are patent aside from LEFT upper lobe bronchials which are narrowed, narrowing of central bronchial structures. No sign of pleural effusion. No substantial septal thickening but some subtle nodularity along the fissure in the LEFT chest is noted. Musculoskeletal: Destructive and expansile rib lesion (image 50/3) 2.5 x 2.1 cm involving the LEFT posterior fifth rib. See below for full musculoskeletal details. CT ABDOMEN PELVIS FINDINGS Hepatobiliary: Surface nodularity along the entire surface of the RIGHT hemi liver peripherally and in more since pouch subcentimeter nodules throughout this location example seen on image 74 of series 2. No parenchymal lesion with suspicious features, small cysts suspected in the LEFT hepatic lobe. Lobular hepatic contours with fissural widening. Portal vein is patent. Pancreas: Normal, without mass, inflammation or ductal dilatation. Mass adjacent to the pancreas likely within the lesser sac. Extensive serosal and peritoneal nodularity throughout the abdomen, see below. No pancreatic ductal dilation or sign of inflammation. Spleen: Spleen normal size and contour. Adrenals/Urinary Tract: Signs of adrenal metastases bilaterally. LEFT adrenal mass (image 61/2) 3.8 x 3.6 cm. RIGHT adrenal mass (image 60/2) 2.5 cm short axis. Infiltrative parenchymal involvement of the RIGHT and LEFT kidney, on the RIGHT 6.8 x 5.0 cm and on the LEFT 6.1 cm greatest axial dimension. Surface nodularity along the renal contour, on the RIGHT, for example 3.1 cm nodule along the upper pole the RIGHT kidney. Additional areas of heterogeneity in the kidneys smaller and more subtle. No hydronephrosis. Signs of  nephrolithiasis with branched calculi, largest in the lower pole on the LEFT 2.7 x 1.8 cm. This extends into the infundibulum and peripheral aspect of the renal sinus. Two smaller  calculi in the lower and interpolar LEFT kidney measuring up to 1.6 cm. Smaller calculi seen on the RIGHT largest 9 mm. Stomach/Bowel: Serosal involvement of the gastric antrum (image 69/2) 2.3 cm. Smaller areas of serosal nodularity along the stomach in this location tracking towards the pancreas. No signs of bowel obstruction or acute bowel process in the setting of extensive peritoneal disease Vascular/Lymphatic: Severe vascular disease. 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 Contrast seen distal to this level in the iliac vasculature. Heavily calcified iliac vessels and plicae that this is a chronic process. Bulky retroperitoneal adenopathy (image 69/2) retrocaval lymph node 2.1 cm. LEFT para-aortic adenopathy for example (image 71/2) 1.6 cm. Intra-aortocaval adenopathy (image 85/2) 1.3 cm. Scattered smaller lymph nodes throughout the retroperitoneum. No pelvic adenopathy. Reproductive: Unremarkable by CT. Other: Extensive peritoneal disease and omental nodularity. No ascites. For example anterior to the transverse colon on image 86/2 is a 2.9 cm peritoneal nodule. Surface nodularity in more since pouch along the under surface of the RIGHT hemi liver as described. Omental nodule in the LEFT upper quadrant (image 75/2) 3.2 cm. Smaller nodules too numerous to count throughout the anterior abdomen elsewhere. Musculoskeletal: Destructive rib lesion in the LEFT chest as described. Lucent area in the LEFT inferior pubic ramus with ill-defined enhancement along the margin of the pubic bone at this level (image 121/2) 3.6 x 1.1 cm. Destructive LEFT acetabular lesion (image 109/2) 2.0 x 1.6 cm. Spinal degenerative changes. IMPRESSION: Metastatic disease throughout the chest, abdomen and pelvis involving lungs, lymph nodes, viscera and bone as discussed. Distribution and appearance would favor pulmonary primary with metastatic disease. Bilateral adrenal and renal involvement.  Infiltrative process in the bilateral kidneys could certainly be seen in the setting of pulmonary metastasis. Biopsy may be helpful given the diffuse nature of disease and the presence of bilateral infiltrative renal lesions. Comparison with prior imaging could also be helpful. Bilateral nephrolithiasis. Signs of extensive vascular disease with chronic appearing aorto iliac occlusive disease Three-vessel coronary artery disease. Aortic atherosclerosis. Aortic Atherosclerosis (ICD10-I70.0) and Emphysema (ICD10-J43.9). Electronically Signed   By: Zetta Bills M.D.   On: 07/05/2021 15:41   CT BIOPSY  Result Date: 07/09/2021 CLINICAL DATA:  Dominant left upper lobe/perihilar lung mass with multiple bilateral lung nodules, mediastinal lymphadenopathy, adrenal masses, renal masses and peritoneal masses. The patient presents for peritoneal biopsy to try to establish a tissue diagnosis. EXAM: CT GUIDED CORE BIOPSY OF PERITONEAL ABDOMINAL MASS ANESTHESIA/SEDATION: Formal conscious sedation was not administered as the patient is on oxygen and currently has hemoptysis. He was given 0.5 mg of IV Versed just prior to beginning the procedure for anxiolysis. PROCEDURE: The procedure risks, benefits, and alternatives were explained to the patient. Questions regarding the procedure were encouraged and answered. The patient understands and consents to the procedure. A time-out was performed prior to initiating the procedure. CT was performed through the abdomen in a supine position. The left lateral abdominal wall was prepped with chlorhexidine in a sterile fashion, and a sterile drape was applied covering the operative field. A sterile gown and sterile gloves were used for the procedure. Local anesthesia was provided with 1% Lidocaine. Under CT guidance, a 17 gauge trocar needle was advanced into the lateral left peritoneal cavity lateral to small bowel and anterior to the descending  colon. Coaxial 18 gauge core biopsy  samples were then obtained of peritoneal masses. A total of 3 core biopsy samples were obtained and submitted in formalin. Gel-Foam pledgets were advanced through the outer needle prior to needle retraction and removal. Additional CT was performed. COMPLICATIONS: None FINDINGS: Multiple nodular peritoneal masses are again seen clustered in the lateral left peritoneal cavity at the level of the mid kidneys. Solid tissue samples were obtained. IMPRESSION: CT-guided core biopsy performed of left lateral peritoneal tumor. Electronically Signed   By: Aletta Edouard M.D.   On: 07/09/2021 14:44   DG Chest Port 1 View  Result Date: 07/23/2021 CLINICAL DATA:  Questionable sepsis. New lung cancer diagnosis with metastasis. Multiple recent falls. EXAM: PORTABLE CHEST 1 VIEW COMPARISON:  None. FINDINGS: Normal cardiac silhouette. Diffuse patchy airspace disease within LEFT and RIGHT lung. Underlying nodularity is less well-defined on the background of airspace disease. No focal consolidation. No pneumothorax. Findings worsened from comparison radiograph. Multiple pulmonary nodules on CT 07/05/2021. IMPRESSION: 1. Worsening pulmonary edema or pulmonary infection pattern. 2. Underlying bilateral pulmonary nodules consistent with pulmonary metastasis. 3. Consider head CT or MRI to exclude brain metastasis with multiple recent falls and new metastatic lung cancer Electronically Signed   By: Suzy Bouchard M.D.   On: 07/23/2021 11:38   DG Chest Portable 1 View  Result Date: 07/05/2021 CLINICAL DATA:  Hemoptysis EXAM: PORTABLE CHEST 1 VIEW COMPARISON:  CT from earlier in the same day. FINDINGS: Cardiac shadow is within normal limits. Soft tissue density is noted in the medial aspect of the left apex consistent with the mass lesion seen on recent CT examination. Fullness in the left hilum is again noted. Scattered pulmonary nodules are noted similar to that seen on prior CT. No acute bony abnormality is noted. Postsurgical  changes in the cervical spine are noted. IMPRESSION: Changes similar to that seen on recent CT examination. No acute abnormality noted. Electronically Signed   By: Inez Catalina M.D.   On: 07/05/2021 21:24   CT Renal Stone Study  Result Date: 07/23/2021 CLINICAL DATA:  Flank pain, kidney stone suspected. Code sepsis. Hypotension. EXAM: CT ABDOMEN AND PELVIS WITHOUT CONTRAST TECHNIQUE: Multidetector CT imaging of the abdomen and pelvis was performed following the standard protocol without IV contrast. COMPARISON:  07/05/2021 FINDINGS: Lower chest: Heart is normal size. Coronary artery and aortic calcifications. Numerous bilateral lower lobe pulmonary nodules and masses. The largest is in the left lung base measuring up to 3.4 x 2.9 cm compared with 2.7 x 2.3 cm previously. Many other pulmonary nodules also appear enlarged since prior study. Trace bilateral pleural effusions. Hepatobiliary: Prior cholecystectomy. Small hypodensity in the left hepatic lobe likely reflects cyst. Prior cholecystectomy. Pancreas: No focal abnormality or ductal dilatation. Spleen: No focal abnormality.  Normal size. Adrenals/Urinary Tract: Bilateral adrenal masses are unchanged. Infiltrative appearing masses in both kidneys again noted, appear unchanged. Bilateral nephrolithiasis. No ureteral stones or hydronephrosis. Small layering stones dependently within the urinary bladder. Bladder wall is unremarkable. Stomach/Bowel: No evidence of bowel obstruction. Vascular/Lymphatic: Aortoiliac atherosclerosis. Retroperitoneal adenopathy is stable. Reproductive: No visible focal abnormality. Other: Extensive peritoneal nodularity/disease and omental nodularity. This has progressed since prior study. Index left peritoneal nodule measures up to 3.7 cm compared to 3.2 cm previously. Other peritoneal/omental nodules appear larger. Musculoskeletal: Destructive left acetabular lesion an ill-defined left inferior pubic ramus lesion again noted,  unchanged. IMPRESSION: Extensive metastatic disease in the chest, abdomen and bony pelvis as described above. This appears progressive with enlarging pulmonary  nodules and omental/peritoneal nodules. Infiltrating masses within both kidneys are difficult to visualize without IV contrast, but likely similar to prior study. Bilateral nephrolithiasis. Small layering urinary bladder stones. No ureteral stones or hydronephrosis. Trace bilateral pleural effusions. Aortic atherosclerosis, coronary artery disease. Electronically Signed   By: Rolm Baptise M.D.   On: 07/23/2021 13:32    Malignant neoplasm of overlapping sites of lung [facility]) #65 year old male patient with newly diagnosed lung cancer-metastatic/stage IV is currently admitted to hospital for severe sepsis/lactic acidosis   #Metastatic lung cancer-stage IV with involvement of the chest/abdomen pelvis/bone.   #MRI brain-today show up at least 3 brain lesions with significant edema.  #Acute respiratory failure-needing up to 4 L of oxygen-severe COPD/underlying pneumonia-antibiotics/steroids  #DNR/DNI  Recommendations:  #I had a long discussion with the patient and the family regarding the serious nature of diagnosis of lung cancer which unfortunately is a terminal illness given stage IV disease.  Discussed that given patient's underlying active infection/sepsis needing Levophed/underlying COPD-lung cancer cannot be treated at this time.  Discussed that treatment options including chemotherapy/radiation could be offered once patient is more clinically stable.   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  We will consider evaluation with radiation oncology once patient is more clinically stable.  #Also agree with palliative care evaluation.   #I had a long discussion with the patient and family regarding the prognosis unfortunately being extremely poor.  Discussed that in general metastatic lung cancer to the brain-survival is in order of 6 to 12  months with all therapeutic options available.  Given patient's current acute illness-it is unclear if and when patient can be started on therapy.  He understands that it is quite possible that he might not be started on therapy if his acute issues do not resolve; and hospice would need to be considered at that point of time.  For now agree with continuing current scope of care.  Thank you Dr.Sreenath for allowing me to participate in the care of your pleasant patient. Please do not hesitate to contact me with questions or concerns in the interim.  All questions were answered. The patient knows to call the clinic with any problems, questions or concerns.   Cammie Sickle, MD 07/24/2021 8:53 PM

## 2021-07-24 NOTE — Progress Notes (Signed)
Pharmacy Antibiotic Note  Frederick Mitchell is a 65 y.o. male admitted on 07/23/2021 with HCAP.  Pharmacy has been consulted for cefepime and vancomycin dosing.  Plan: Cefepime 2 g IV q8h Scr 1.03>0.56. Will adjust vancomycin from 1500 mg IV q24h to 2000 mg IV q24h Est AUC: 501 Used: Scr 0.8 (rounded up), IBW, Vd 0.72 Obtain vanc level around 4th or 5th dose if continued Monitor renal function and adjust dose as clinically indicated Follow up MRSA PCR  Height: 5\' 7"  (170.2 cm) Weight: 75 kg (165 lb 5.5 oz) IBW/kg (Calculated) : 66.1  Temp (24hrs), Avg:98.5 F (36.9 C), Min:98.5 F (36.9 C), Max:98.5 F (36.9 C)  Recent Labs  Lab 07/23/21 1051 07/23/21 1222 07/23/21 1853 07/24/21 0233 07/24/21 0922  WBC 11.4*  --   --  14.1*  --   CREATININE 1.03  --   --  1.03 0.56*  LATICACIDVEN 2.3* 2.0* 1.5 1.8  --      Estimated Creatinine Clearance: 83.8 mL/min (A) (by C-G formula based on SCr of 0.56 mg/dL (L)).    No Known Allergies  Antimicrobials this admission: 12/26 ceftriaxone/azithromycin x 1 12/26 cefepime >>  12/26 vancomycin >>    Microbiology results: 12/26 BCx: GPR 12/26 UCx: sent 12/26 MRSA PCR: pending collection  Thank you for allowing pharmacy to be a part of this patients care.  Forde Dandy Phyillis Dascoli 07/24/2021 10:57 AM

## 2021-07-24 NOTE — Progress Notes (Signed)
PHARMACY - PHYSICIAN COMMUNICATION CRITICAL VALUE ALERT - BLOOD CULTURE IDENTIFICATION (BCID)  BCID results for this pt:  1 set (both bottles) of 2 with GPRs.  Pt currently on Vancomycin and Cefepime.  Name of physician contacted: Harvest Forest, MD  Changes to prescribed antibiotics required: No changes at this time.  Renda Rolls, PharmD, [facility] 07/24/2021 4:09 AM

## 2021-07-24 NOTE — ED Notes (Signed)
This RN to bedside to check on pt, updated pt and wife on bed status; pt requests milk

## 2021-07-24 NOTE — ED Notes (Signed)
Advanced directives packet brought to registration staff to place in pts chart. He will return packet to pt/family as soon as this has been copied for pts chart. This RN communicated this with pts sister at [facility].

## 2021-07-24 NOTE — ED Notes (Signed)
ED TO INPATIENT HANDOFF REPORT  ED Nurse Name and Phone #:   S Name/Age/Gender Frederick Mitchell 65 y.o. male Room/Bed: ED08A/ED08A  Code Status   Code Status: DNR  Home/SNF/Other Home Patient oriented to: self, place, time, and situation Is this baseline? Yes   Triage Complete: Triage complete  Chief Complaint Severe sepsis ([place]) [A41.9, R65.20] HCAP (healthcare-associated pneumonia) [J18.9]  Triage Note Pt deaf. Interpreter [phone] used Pt reports he is not eating right for the past week d/t cause Ems reports hypotensive, HR 100  500NS given, 18G to left hand and right AC PTA  Reports stage 4 cancer "all over", no chemo  Reports shob that usual from COPD. Does not wear O2 at home  Reports multiple falls yesterday  Pt cannot answer what medication allergies he has   Allergies No Known Allergies  Level of Care/Admitting Diagnosis ED Disposition     ED Disposition  Admit   Condition  --   Comment  Hospital Area: [facility] [[id number]]  Level of Care: Stepdown [14]  Covid Evaluation: Confirmed COVID Negative  Diagnosis: HCAP (healthcare-associated pneumonia) [875643]  Admitting Physician: Ivor Costa [[id number]]  Attending Physician: Ivor Costa [[id number]]  Estimated length of stay: past midnight tomorrow  Certification:: I certify this patient will need inpatient services for at least 2 midnights          B Medical/Surgery History Past Medical History:  Diagnosis Date   COPD (chronic obstructive pulmonary disease) ([place])    Deaf    Hypercholesteremia unk   Past Surgical History:  Procedure Laterality Date   EYE SURGERY       A IV Location/Drains/Wounds Patient Lines/Drains/Airways Status     Active Line/Drains/Airways     Name Placement date Placement time Site Days   Peripheral IV 07/23/21 18 G Right Antecubital 07/23/21  1055  Antecubital  1   Peripheral IV 07/23/21 18 G Anterior;Left Hand 07/23/21  1055  Hand  1   Peripheral IV  07/23/21 20 G Anterior;Left Forearm 07/23/21  1520  Forearm  1            Intake/Output Last 24 hours  Intake/Output Summary (Last 24 hours) at 07/24/2021 1907 Last data filed at 07/24/2021 3295 Gross per 24 hour  Intake 100 ml  Output --  Net 100 ml    Labs/Imaging Results for orders placed or performed during the hospital encounter of 07/23/21 (from the past 48 hour(s))  Lactic acid, plasma     Status: Abnormal   Collection Time: 07/23/21 10:51 AM  Result Value Ref Range   Lactic Acid, Venous 2.3 (HH) 0.5 - 1.9 mmol/L    Comment: CRITICAL RESULT CALLED TO, READ BACK BY AND VERIFIED WITH [place] [facility] 07/23/21 1203 MW Performed at [facility], [address] [place], [place] [address]   Comprehensive metabolic panel     Status: Abnormal   Collection Time: 07/23/21 10:51 AM  Result Value Ref Range   Sodium 124 (L) 135 - 145 mmol/L   Potassium 3.8 3.5 - 5.1 mmol/L   Chloride 93 (L) 98 - 111 mmol/L   CO2 22 22 - 32 mmol/L   Glucose, Bld 60 (L) 70 - 99 mg/dL    Comment: Glucose reference range applies only to samples taken after fasting for at least 8 hours.   BUN 20 8 - 23 mg/dL   Creatinine, Ser 1.03 0.61 - 1.24 mg/dL   Calcium 9.1 8.9 - 10.3 mg/dL   Total Protein 7.0  6.5 - 8.1 g/dL   Albumin 2.7 (L) 3.5 - 5.0 g/dL   AST 140 (H) 15 - 41 U/L   ALT 54 (H) 0 - 44 U/L   Alkaline Phosphatase 99 38 - 126 U/L   Total Bilirubin 0.9 0.3 - 1.2 mg/dL   GFR, Estimated >60 >60 mL/min    Comment: (NOTE) Calculated using the CKD-EPI Creatinine Equation (2021)    Anion gap 9 5 - 15    Comment: Performed at [facility], [address] [place], [place] [address]  CBC WITH DIFFERENTIAL     Status: Abnormal   Collection Time: 07/23/21 10:51 AM  Result Value Ref Range   WBC 11.4 (H) 4.0 - 10.5 K/uL   RBC 4.21 (L) 4.22 - 5.81 MIL/uL   Hemoglobin 12.9 (L) 13.0 - 17.0 g/dL   HCT 38.3 (L) 39.0 - 52.0 %   MCV 91.0 80.0 - 100.0 fL   MCH 30.6 26.0 - 34.0 pg    MCHC 33.7 30.0 - 36.0 g/dL   RDW 15.3 11.5 - 15.5 %   Platelets 203 150 - 400 K/uL   nRBC 0.0 0.0 - 0.2 %   Neutrophils Relative % 77 %   Neutro Abs 8.7 (H) 1.7 - 7.7 K/uL   Lymphocytes Relative 8 %   Lymphs Abs 0.9 0.7 - 4.0 K/uL   Monocytes Relative 7 %   Monocytes Absolute 0.8 0.1 - 1.0 K/uL   Eosinophils Relative 0 %   Eosinophils Absolute 0.1 0.0 - 0.5 K/uL   Basophils Relative 0 %   Basophils Absolute 0.0 0.0 - 0.1 K/uL   WBC Morphology MILD LEFT SHIFT (1-5% METAS, OCC MYELO, OCC BANDS)    RBC Morphology MORPHOLOGY UNREMARKABLE    Smear Review Normal platelet morphology    Immature Granulocytes 8 %   Abs Immature Granulocytes 0.91 (H) 0.00 - 0.07 K/uL    Comment: Performed at [facility], [address] [place], [place] [address]  Protime-INR     Status: None   Collection Time: 07/23/21 10:51 AM  Result Value Ref Range   Prothrombin Time 14.0 11.4 - 15.2 seconds   INR 1.1 0.8 - 1.2    Comment: (NOTE) INR goal varies based on device and disease states. Performed at [facility], [address] [place], [place] [address]   APTT     Status: None   Collection Time: 07/23/21 10:51 AM  Result Value Ref Range   aPTT 33 24 - 36 seconds    Comment: Performed at [facility], [address] [place], [place] [address]  Blood Culture (routine x 2)     Status: None (Preliminary result)   Collection Time: 07/23/21 10:51 AM   Specimen: BLOOD  Result Value Ref Range   Specimen Description BLOOD RIGHT ANTECUBITAL    Special Requests      BOTTLES DRAWN AEROBIC AND ANAEROBIC Blood Culture adequate volume   Culture      NO GROWTH < 24 HOURS Performed at [facility], [address] [place], [place] [address]    Report Status PENDING   Urinalysis, Complete w Microscopic     Status: Abnormal   Collection Time: 07/23/21 10:51 AM  Result Value Ref Range   Color, Urine AMBER (A) YELLOW    Comment: BIOCHEMICALS MAY BE AFFECTED BY COLOR    APPearance CLEAR CLEAR   Specific Gravity, Urine 1.015 1.005 - 1.030   pH 6.0 5.0 - 8.0   Glucose, UA  NEGATIVE NEGATIVE mg/dL   Hgb urine dipstick LARGE (A) NEGATIVE   Bilirubin Urine SMALL (A) NEGATIVE   Ketones, ur 15 (A) NEGATIVE mg/dL   Protein, ur 100 (A) NEGATIVE mg/dL   Nitrite NEGATIVE NEGATIVE   Leukocytes,Ua NEGATIVE NEGATIVE   Squamous Epithelial / LPF 0-5 0 - 5   WBC, UA 0-5 0 - 5 WBC/hpf   RBC / HPF >50 0 - 5 RBC/hpf   Bacteria, UA MANY (A) NONE SEEN    Comment: Performed at [facility], [address] [place], [place] [address]  Sodium, urine, random     Status: None   Collection Time: 07/23/21 10:51 AM  Result Value Ref Range   Sodium, Ur <10 mmol/L    Comment: Performed at [facility], [address] [place], [place] [address]  Strep pneumoniae urinary antigen     Status: None   Collection Time: 07/23/21 10:51 AM  Result Value Ref Range   Strep Pneumo Urinary Antigen NEGATIVE NEGATIVE    Comment:        Infection due to S. pneumoniae cannot be absolutely ruled out since the antigen present may be below the detection limit of the test. Performed at [facility], [address] [place], [place] [address]   Osmolality, urine     Status: Abnormal   Collection Time: 07/23/21 10:51 AM  Result Value Ref Range   Osmolality, Ur 291 (L) 300 - 900 mOsm/kg    Comment: Performed at [facility], [address], [place], [place] [address]  Legionella Pneumophila Serogp 1 Ur Ag     Status: None   Collection Time: 07/23/21 10:51 AM  Result Value Ref Range   L. pneumophila Serogp 1 Ur Ag Negative Negative    Comment: (NOTE) Presumptive negative for L. pneumophila serogroup 1 antigen in urine, suggesting no recent or current infection. Legionnaires' disease cannot be ruled out since other serogroups and species may also cause disease. Performed At: [facility] [place], [place] [id number] Rush Farmer MD  YT:[id number]    Source of Sample URINE, RANDOM     Comment: Performed at [facility], [address] [place], [place] [address]  Blood Culture (routine x 2)     Status: None (Preliminary result)   Collection Time: 07/23/21 10:53 AM   Specimen: BLOOD  Result Value Ref Range   Specimen Description BLOOD LEFT ANTECUBITAL    Special Requests      BOTTLES DRAWN AEROBIC AND ANAEROBIC Blood Culture adequate volume   Culture  Setup Time      GRAM POSITIVE RODS CRITICAL RESULT CALLED TO, READ BACK BY AND VERIFIED WITH: NATHAN BELUE @ 2025 ON 07/23/21.Marland KitchenMarland KitchenTKR IN BOTH AEROBIC AND ANAEROBIC BOTTLES Performed at [facility], [address] [place], [place] [address]    Culture GRAM POSITIVE RODS    Report Status PENDING   Resp Panel by RT-PCR (Flu A&B, Covid) Nasopharyngeal Swab     Status: None   Collection Time: 07/23/21 10:56 AM   Specimen: Nasopharyngeal Swab; Nasopharyngeal(NP) swabs in vial transport medium  Result Value Ref Range   SARS Coronavirus 2 by RT PCR NEGATIVE NEGATIVE    Comment: (NOTE) SARS-CoV-2 target nucleic acids are NOT DETECTED.  The SARS-CoV-2 RNA is generally detectable in upper respiratory specimens during the acute phase of infection. The lowest concentration of SARS-CoV-2 viral copies this assay can detect is 138 copies/mL. A negative result does not preclude SARS-Cov-2 infection and should not  be used as the sole basis for treatment or other patient management decisions. A negative result may occur with  improper specimen collection/handling, submission of specimen other than nasopharyngeal swab, presence of viral mutation(s) within the areas targeted by this assay, and inadequate number of viral copies(<138 copies/mL). A negative result must be combined with clinical observations, patient history, and epidemiological information. The expected result is Negative.  Fact Sheet for Patients:   EntrepreneurPulse.com.au  Fact Sheet for Healthcare Providers:  IncredibleEmployment.be  This test is no t yet approved or cleared by the [place] FDA and  has been authorized for detection and/or diagnosis of SARS-CoV-2 by FDA under an Emergency Use Authorization (EUA). This EUA will remain  in effect (meaning this test can be used) for the duration of the COVID-19 declaration under Section 564(b)(1) of the Act, 21 U.S.C.section 360bbb-3(b)(1), unless the authorization is terminated  or revoked sooner.       Influenza A by PCR NEGATIVE NEGATIVE   Influenza B by PCR NEGATIVE NEGATIVE    Comment: (NOTE) The Xpert Xpress SARS-CoV-2/FLU/RSV plus assay is intended as an aid in the diagnosis of influenza from Nasopharyngeal swab specimens and should not be used as a sole basis for treatment. Nasal washings and aspirates are unacceptable for Xpert Xpress SARS-CoV-2/FLU/RSV testing.  Fact Sheet for Patients: EntrepreneurPulse.com.au  Fact Sheet for Healthcare Providers: IncredibleEmployment.be  This test is not yet approved or cleared by the [place] FDA and has been authorized for detection and/or diagnosis of SARS-CoV-2 by FDA under an Emergency Use Authorization (EUA). This EUA will remain in effect (meaning this test can be used) for the duration of the COVID-19 declaration under Section 564(b)(1) of the Act, 21 U.S.C. section 360bbb-3(b)(1), unless the authorization is terminated or revoked.  Performed at [facility], [address] [place], [place] [address]   Lactic acid, plasma     Status: Abnormal   Collection Time: 07/23/21 12:22 PM  Result Value Ref Range   Lactic Acid, Venous 2.0 (HH) 0.5 - 1.9 mmol/L    Comment: CRITICAL VALUE NOTED. VALUE IS CONSISTENT WITH PREVIOUSLY REPORTED/CALLED VALUE MU Performed at [facility], [address] [place], [place]  [address]   Lactic acid, plasma     Status: None   Collection Time: 07/23/21  6:53 PM  Result Value Ref Range   Lactic Acid, Venous 1.5 0.5 - 1.9 mmol/L    Comment: Performed at [facility], [address] [place], [place] [address]  Basic metabolic panel     Status: Abnormal   Collection Time: 07/24/21  2:33 AM  Result Value Ref Range   Sodium 129 (L) 135 - 145 mmol/L   Potassium 4.5 3.5 - 5.1 mmol/L    Comment: HEMOLYSIS AT THIS LEVEL MAY AFFECT RESULT   Chloride 98 98 - 111 mmol/L   CO2 22 22 - 32 mmol/L   Glucose, Bld 151 (H) 70 - 99 mg/dL    Comment: Glucose reference range applies only to samples taken after fasting for at least 8 hours.   BUN 21 8 - 23 mg/dL   Creatinine, Ser 1.03 0.61 - 1.24 mg/dL   Calcium 8.4 (L) 8.9 - 10.3 mg/dL   GFR, Estimated >60 >60 mL/min    Comment: (NOTE) Calculated using the CKD-EPI Creatinine Equation (2021)    Anion gap 9 5 - 15    Comment: Performed at [facility], [address] [place], [place] [address]  CBC  Status: Abnormal   Collection Time: 07/24/21  2:33 AM  Result Value Ref Range   WBC 14.1 (H) 4.0 - 10.5 K/uL   RBC 3.90 (L) 4.22 - 5.81 MIL/uL   Hemoglobin 12.0 (L) 13.0 - 17.0 g/dL   HCT 36.0 (L) 39.0 - 52.0 %   MCV 92.3 80.0 - 100.0 fL   MCH 30.8 26.0 - 34.0 pg   MCHC 33.3 30.0 - 36.0 g/dL   RDW 15.3 11.5 - 15.5 %   Platelets 202 150 - 400 K/uL   nRBC 0.0 0.0 - 0.2 %    Comment: Performed at [facility], [address] [place], [place] [address]  Lactic acid, plasma     Status: None   Collection Time: 07/24/21  2:33 AM  Result Value Ref Range   Lactic Acid, Venous 1.8 0.5 - 1.9 mmol/L    Comment: Performed at [facility], [address], [place], [place] [address]  Osmolality     Status: None   Collection Time: 07/24/21  6:54 AM  Result Value Ref Range   Osmolality 287 275 - 295 mOsm/kg    Comment: Performed at [facility], [address] [place], [place]  [address]  Procalcitonin     Status: None   Collection Time: 07/24/21  6:54 AM  Result Value Ref Range   Procalcitonin 1.21 ng/mL    Comment:        Interpretation: PCT > 0.5 ng/mL and <= 2 ng/mL: Systemic infection (sepsis) is possible, but other conditions are known to elevate PCT as well. (NOTE)       Sepsis PCT Algorithm           Lower Respiratory Tract                                      Infection PCT Algorithm    ----------------------------     ----------------------------         PCT < 0.25 ng/mL                PCT < 0.10 ng/mL          Strongly encourage             Strongly discourage   discontinuation of antibiotics    initiation of antibiotics    ----------------------------     -----------------------------       PCT 0.25 - 0.50 ng/mL            PCT 0.10 - 0.25 ng/mL               OR       >80% decrease in PCT            Discourage initiation of                                            antibiotics      Encourage discontinuation           of antibiotics    ----------------------------     -----------------------------         PCT >= 0.50 ng/mL              PCT 0.26 - 0.50 ng/mL  AND       <80% decrease in PCT             Encourage initiation of                                             antibiotics       Encourage continuation           of antibiotics    ----------------------------     -----------------------------        PCT >= 0.50 ng/mL                  PCT > 0.50 ng/mL               AND         increase in PCT                  Strongly encourage                                      initiation of antibiotics    Strongly encourage escalation           of antibiotics                                     -----------------------------                                           PCT <= 0.25 ng/mL                                                 OR                                        > 80% decrease in PCT                                       Discontinue / Do not initiate                                             antibiotics  Performed at [facility], [address] [place], [place] [address]   CBG monitoring, ED     Status: Abnormal   Collection Time: 07/24/21  8:12 AM  Result Value Ref Range   Glucose-Capillary 204 (H) 70 - 99 mg/dL    Comment: Glucose reference range applies only to samples taken after fasting for at least 8 hours.  Basic metabolic panel     Status: Abnormal   Collection Time: 07/24/21  9:22 AM  Result Value Ref Range   Sodium 139 135 - 145 mmol/L   Potassium 4.0 3.5 -  5.1 mmol/L    Comment: HEMOLYSIS AT THIS LEVEL MAY AFFECT RESULT   Chloride 106 98 - 111 mmol/L   CO2 16 (L) 22 - 32 mmol/L   Glucose, Bld 195 (H) 70 - 99 mg/dL    Comment: Glucose reference range applies only to samples taken after fasting for at least 8 hours.   BUN 17 8 - 23 mg/dL   Creatinine, Ser 0.56 (L) 0.61 - 1.24 mg/dL   Calcium 6.6 (L) 8.9 - 10.3 mg/dL   GFR, Estimated >60 >60 mL/min    Comment: (NOTE) Calculated using the CKD-EPI Creatinine Equation (2021)    Anion gap 17 (H) 5 - 15    Comment: Performed at [facility], [address] [place], [place] [address]   MR BRAIN W WO CONTRAST  Result Date: 07/24/2021 CLINICAL DATA:  Lung cancer staging EXAM: MRI HEAD WITHOUT AND WITH CONTRAST TECHNIQUE: Multiplanar, multiecho pulse sequences of the brain and surrounding structures were obtained without and with intravenous contrast. CONTRAST:  7.32mL GADAVIST GADOBUTROL 1 MMOL/ML IV SOLN COMPARISON:  None recent or similar FINDINGS: Brain: 9 mm cortically based mass in the left occipital lobe with mild vasogenic edema. Two adjacent cortically based lesions in the left frontal parietal region measuring 2 cm more anteriorly and 8 mm more posteriorly. Moderate vasogenic edema in the high left cerebrum. The largest lesion has chronic blood products. Asymmetric somewhat nodular thickening in the choroid plexus at  the right temporal lobe measuring 5 mm, indeterminate. Ill-defined thickening of the pituitary gland for age, measuring 11 mm craniocaudal. No acute infarct, hemorrhage, hydrocephalus, or collection. Vascular: Normal flow voids and vessel enhancements. Skull and upper cervical spine: Normal marrow signal Sinuses/Orbits: Negative IMPRESSION: Positive for cerebral metastatic disease with at least 3 lesions seen in the left cerebrum, measuring up to 2 cm at the frontal parietal junction where there is moderate vasogenic edema. Pituitary thickening and nodular thickening at the right choroid plexus could also be from metastatic disease, attention on follow-up. Electronically Signed   By: Jorje Guild M.D.   On: 07/24/2021 10:56   DG Chest Port 1 View  Result Date: 07/23/2021 CLINICAL DATA:  Questionable sepsis. New lung cancer diagnosis with metastasis. Multiple recent falls. EXAM: PORTABLE CHEST 1 VIEW COMPARISON:  None. FINDINGS: Normal cardiac silhouette. Diffuse patchy airspace disease within LEFT and RIGHT lung. Underlying nodularity is less well-defined on the background of airspace disease. No focal consolidation. No pneumothorax. Findings worsened from comparison radiograph. Multiple pulmonary nodules on CT 07/05/2021. IMPRESSION: 1. Worsening pulmonary edema or pulmonary infection pattern. 2. Underlying bilateral pulmonary nodules consistent with pulmonary metastasis. 3. Consider head CT or MRI to exclude brain metastasis with multiple recent falls and new metastatic lung cancer Electronically Signed   By: Suzy Bouchard M.D.   On: 07/23/2021 11:38   CT Renal Stone Study  Result Date: 07/23/2021 CLINICAL DATA:  Flank pain, kidney stone suspected. Code sepsis. Hypotension. EXAM: CT ABDOMEN AND PELVIS WITHOUT CONTRAST TECHNIQUE: Multidetector CT imaging of the abdomen and pelvis was performed following the standard protocol without IV contrast. COMPARISON:  07/05/2021 FINDINGS: Lower chest: Heart  is normal size. Coronary artery and aortic calcifications. Numerous bilateral lower lobe pulmonary nodules and masses. The largest is in the left lung base measuring up to 3.4 x 2.9 cm compared with 2.7 x 2.3 cm previously. Many other pulmonary nodules also appear enlarged since prior study. Trace bilateral pleural effusions. Hepatobiliary: Prior cholecystectomy. Small hypodensity in the left hepatic lobe likely  reflects cyst. Prior cholecystectomy. Pancreas: No focal abnormality or ductal dilatation. Spleen: No focal abnormality.  Normal size. Adrenals/Urinary Tract: Bilateral adrenal masses are unchanged. Infiltrative appearing masses in both kidneys again noted, appear unchanged. Bilateral nephrolithiasis. No ureteral stones or hydronephrosis. Small layering stones dependently within the urinary bladder. Bladder wall is unremarkable. Stomach/Bowel: No evidence of bowel obstruction. Vascular/Lymphatic: Aortoiliac atherosclerosis. Retroperitoneal adenopathy is stable. Reproductive: No visible focal abnormality. Other: Extensive peritoneal nodularity/disease and omental nodularity. This has progressed since prior study. Index left peritoneal nodule measures up to 3.7 cm compared to 3.2 cm previously. Other peritoneal/omental nodules appear larger. Musculoskeletal: Destructive left acetabular lesion an ill-defined left inferior pubic ramus lesion again noted, unchanged. IMPRESSION: Extensive metastatic disease in the chest, abdomen and bony pelvis as described above. This appears progressive with enlarging pulmonary nodules and omental/peritoneal nodules. Infiltrating masses within both kidneys are difficult to visualize without IV contrast, but likely similar to prior study. Bilateral nephrolithiasis. Small layering urinary bladder stones. No ureteral stones or hydronephrosis. Trace bilateral pleural effusions. Aortic atherosclerosis, coronary artery disease. Electronically Signed   By: Rolm Baptise M.D.   On:  07/23/2021 13:32    Pending Labs Unresulted Labs (From admission, onward)     Start     Ordered   07/25/21 0500  CBC with Differential/Platelet  Tomorrow morning,   STAT        07/24/21 1553   07/25/21 2671  Basic metabolic panel  Tomorrow morning,   STAT        07/24/21 1553   07/25/21 0500  Magnesium  Tomorrow morning,   STAT        07/24/21 1553   07/23/21 1818  C Difficile Quick Screen w PCR reflex  (C Difficile quick screen w PCR reflex panel )  Once, for 24 hours,   STAT       References:    CDiff Information Tool   07/23/21 1817   07/23/21 1818  Gastrointestinal Panel by PCR , Stool  (Gastrointestinal Panel by PCR, Stool                                                                                                                                                     **Does Not include CLOSTRIDIUM DIFFICILE testing. **If CDIFF testing is needed, place order from the "C Difficile Testing" order set.**)  Once,   STAT        07/23/21 1817   07/23/21 1732  Expectorated Sputum Assessment w Gram Stain, Rflx to Resp Cult  (COPD / Pneumonia / Cellulitis / Lower Extremity Wound)  Once,   STAT        07/23/21 1733   07/23/21 1253  MRSA Next Gen by PCR, Nasal  ONCE - STAT,   STAT        07/23/21 1252   07/23/21 1056  Urine Culture  (Septic presentation on arrival (screening labs, nursing and treatment orders for obvious sepsis))  ONCE - STAT,   STAT       Question:  Indication  Answer:  Sepsis   07/23/21 1056            Vitals/Pain Today's Vitals   07/24/21 1700 07/24/21 1730 07/24/21 1800 07/24/21 1830  BP: 127/71 132/66 128/68 128/69  Pulse: 89 83 72 79  Resp: (!) 22 19 18    Temp:      TempSrc:      SpO2: 98% 98% 96% 96%  Weight:      Height:      PainSc:        Isolation Precautions Enteric precautions (UV disinfection)  Medications Medications  norepinephrine (LEVOPHED) 4mg  in 2110mL (0.016 mg/mL) premix infusion (6 mcg/min Intravenous New Bag/Given 07/24/21 1422)   albuterol (PROVENTIL) (2.5 MG/3ML) 0.083% nebulizer solution 2.5 mg (has no administration in time range)  dextromethorphan-guaiFENesin (MUCINEX DM) 30-600 MG per 12 hr tablet 1 tablet (has no administration in time range)  ondansetron (ZOFRAN) injection 4 mg (has no administration in time range)  acetaminophen (TYLENOL) tablet 650 mg (has no administration in time range)  nicotine (NICODERM CQ - dosed in mg/24 hours) patch 21 mg (21 mg Transdermal Patient Refused/Not Given 07/24/21 0927)  dextrose 50 % solution 50 mL (has no administration in time range)  0.9 %  sodium chloride infusion ( Intravenous Rate/Dose Verify 07/24/21 1201)  doxepin (SINEQUAN) capsule 50 mg (50 mg Oral Given 07/24/21 0229)  hydrOXYzine (ATARAX) tablet 10 mg (has no administration in time range)  ipratropium-albuterol (DUONEB) 0.5-2.5 (3) MG/3ML nebulizer solution 3 mL (3 mLs Nebulization Given 07/24/21 1549)  enoxaparin (LOVENOX) injection 40 mg (40 mg Subcutaneous Not Given 07/23/21 2224)  ceFEPIme (MAXIPIME) 2 g in sodium chloride 0.9 % 100 mL IVPB (0 g Intravenous Stopped 07/24/21 1851)  methylPREDNISolone sodium succinate (SOLU-MEDROL) 40 mg/mL injection 40 mg (40 mg Intravenous Given 07/24/21 1815)  vancomycin (VANCOREADY) IVPB 2000 mg/400 mL (0 mg Intravenous Stopped 07/24/21 1550)  lactated ringers bolus 1,000 mL (0 mLs Intravenous Stopped 07/23/21 1220)  ipratropium-albuterol (DUONEB) 0.5-2.5 (3) MG/3ML nebulizer solution 3 mL (3 mLs Nebulization Given 07/23/21 1224)  azithromycin (ZITHROMAX) 500 mg in sodium chloride 0.9 % 250 mL IVPB (0 mg Intravenous Stopped 07/23/21 1403)  vancomycin (VANCOREADY) IVPB 1500 mg/300 mL (0 mg Intravenous Stopped 07/23/21 1625)  lactated ringers bolus 1,000 mL (0 mLs Intravenous Stopped 07/23/21 1525)  hydrocortisone sodium succinate (SOLU-CORTEF) 100 MG injection 100 mg (100 mg Intravenous Given 07/23/21 1554)  gadobutrol (GADAVIST) 1 MMOL/ML injection 7.5 mL (7.5 mLs  Intravenous Contrast Given 07/24/21 1044)    Mobility non-ambulatory Moderate fall risk   Focused Assessments    R Recommendations: See Admitting Provider Note  Report given to:   Additional Notes:  pt and spouse are deaf; can read lips

## 2021-07-24 NOTE — Assessment & Plan Note (Addendum)
#  65 year old male patient with newly diagnosed lung cancer-metastatic/stage IV is currently admitted to hospital for severe sepsis/lactic acidosis   #Metastatic lung cancer-stage IV with involvement of the chest/abdomen pelvis/bone.   #MRI brain-today show up at least 3 brain lesions with significant edema.  #Acute respiratory failure-needing up to 4 L of oxygen-severe COPD/underlying pneumonia-antibiotics/steroids  #DNR/DNI  Recommendations:  #I had a long discussion with the patient and the family regarding the serious nature of diagnosis of lung cancer which unfortunately is a terminal illness given stage IV disease.  Discussed that given patient's underlying active infection/sepsis needing Levophed/underlying COPD-lung cancer cannot be treated at this time.  Discussed that treatment options including chemotherapy/radiation could be offered once patient is more clinically stable.   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  We will consider evaluation with radiation oncology once patient is more clinically stable.  #Also agree with palliative care evaluation.   #I had a long discussion with the patient and family regarding the prognosis unfortunately being extremely poor.  Discussed that in general metastatic lung cancer to the brain-survival is in order of 6 to 12 months with all therapeutic options available.  Given patient's current acute illness-it is unclear if and when patient can be started on therapy.  He understands that it is quite possible that he might not be started on therapy if his acute issues do not resolve; and hospice would need to be considered at that point of time.  For now agree with continuing current scope of care.  Thank you Dr.Sreenath for allowing me to participate in the care of your pleasant patient. Please do not hesitate to contact me with questions or concerns in the interim.

## 2021-07-24 NOTE — ED Notes (Signed)
Kara RN aware of assigned bed 

## 2021-07-24 NOTE — ED Notes (Signed)
Milk given

## 2021-07-24 NOTE — ED Notes (Signed)
Bed and linens changed, pericare performed, Malewick placed, pt repositioned

## 2021-07-25 DIAGNOSIS — J441 Chronic obstructive pulmonary disease with (acute) exacerbation: Secondary | ICD-10-CM | POA: Diagnosis not present

## 2021-07-25 LAB — CBC WITH DIFFERENTIAL/PLATELET
Abs Immature Granulocytes: 1.15 10*3/uL — ABNORMAL HIGH (ref 0.00–0.07)
Basophils Absolute: 0.1 10*3/uL (ref 0.0–0.1)
Basophils Relative: 1 %
Eosinophils Absolute: 0 10*3/uL (ref 0.0–0.5)
Eosinophils Relative: 0 %
HCT: 34.8 % — ABNORMAL LOW (ref 39.0–52.0)
Hemoglobin: 11.5 g/dL — ABNORMAL LOW (ref 13.0–17.0)
Immature Granulocytes: 7 %
Lymphocytes Relative: 5 %
Lymphs Abs: 0.9 10*3/uL (ref 0.7–4.0)
MCH: 31.1 pg (ref 26.0–34.0)
MCHC: 33 g/dL (ref 30.0–36.0)
MCV: 94.1 fL (ref 80.0–100.0)
Monocytes Absolute: 1.3 10*3/uL — ABNORMAL HIGH (ref 0.1–1.0)
Monocytes Relative: 8 %
Neutro Abs: 13.2 10*3/uL — ABNORMAL HIGH (ref 1.7–7.7)
Neutrophils Relative %: 79 %
Platelets: 147 10*3/uL — ABNORMAL LOW (ref 150–400)
RBC: 3.7 MIL/uL — ABNORMAL LOW (ref 4.22–5.81)
RDW: 15.5 % (ref 11.5–15.5)
Smear Review: NORMAL
WBC: 16.6 10*3/uL — ABNORMAL HIGH (ref 4.0–10.5)
nRBC: 0 % (ref 0.0–0.2)

## 2021-07-25 LAB — MAGNESIUM: Magnesium: 1.8 mg/dL (ref 1.7–2.4)

## 2021-07-25 LAB — BASIC METABOLIC PANEL
Anion gap: 17 — ABNORMAL HIGH (ref 5–15)
Anion gap: 9 (ref 5–15)
BUN: 17 mg/dL (ref 8–23)
BUN: 22 mg/dL (ref 8–23)
CO2: 16 mmol/L — ABNORMAL LOW (ref 22–32)
CO2: 20 mmol/L — ABNORMAL LOW (ref 22–32)
Calcium: 6.6 mg/dL — ABNORMAL LOW (ref 8.9–10.3)
Calcium: 8.8 mg/dL — ABNORMAL LOW (ref 8.9–10.3)
Chloride: 106 mmol/L (ref 98–111)
Chloride: 104 mmol/L (ref 98–111)
Creatinine, Ser: 0.56 mg/dL — ABNORMAL LOW (ref 0.61–1.24)
Creatinine, Ser: 0.8 mg/dL (ref 0.61–1.24)
GFR, Estimated: >60 mL/min (ref >60)
GFR, Estimated: >60 mL/min (ref >60)
Glucose, Bld: 195 mg/dL — ABNORMAL HIGH (ref 70–99)
Glucose, Bld: 140 mg/dL — ABNORMAL HIGH (ref 70–99)
Potassium: 4 mmol/L (ref 3.5–5.1)
Potassium: 3.6 mmol/L (ref 3.5–5.1)
Sodium: 139 mmol/L (ref 135–145)
Sodium: 133 mmol/L — ABNORMAL LOW (ref 135–145)

## 2021-07-25 LAB — MRSA NEXT GEN BY PCR, NASAL: MRSA by PCR Next Gen: NOT DETECTED

## 2021-07-25 LAB — CBG MONITORING, ED: Glucose-Capillary: 137 mg/dL — ABNORMAL HIGH (ref 70–99)

## 2021-07-25 MED ORDER — ARFORMOTEROL TARTRATE 15 MCG/2ML IN NEBU
15.0000 ug | INHALATION_SOLUTION | Freq: Two times a day (BID) | RESPIRATORY_TRACT | Status: DC
  Administered 2021-07-26 (×2): 15 ug via RESPIRATORY_TRACT
  Filled 2021-07-25 (×4): qty 2

## 2021-07-25 MED ORDER — BUDESONIDE 0.5 MG/2ML IN SUSP
0.5000 mg | Freq: Two times a day (BID) | RESPIRATORY_TRACT | Status: DC
  Administered 2021-07-26: 10:00:00 0.5 mg via RESPIRATORY_TRACT
  Filled 2021-07-25 (×2): qty 2

## 2021-07-25 MED ORDER — DEXAMETHASONE SODIUM PHOSPHATE 4 MG/ML IJ SOLN
4.0000 mg | Freq: Three times a day (TID) | INTRAMUSCULAR | Status: DC
  Administered 2021-07-25 – 2021-07-26 (×3): 4 mg via INTRAVENOUS
  Filled 2021-07-25 (×3): qty 1

## 2021-07-25 MED ORDER — VANCOMYCIN HCL 2000 MG/400ML IV SOLN
2000.0000 mg | INTRAVENOUS | Status: DC
  Administered 2021-07-25: 16:00:00 2000 mg via INTRAVENOUS
  Filled 2021-07-25: qty 400

## 2021-07-25 MED ORDER — IPRATROPIUM-ALBUTEROL 0.5-2.5 (3) MG/3ML IN SOLN: 3.0000 mL | Freq: Four times a day (QID) | RESPIRATORY_TRACT | Status: DC | PRN

## 2021-07-25 NOTE — Progress Notes (Signed)
Hematology/Oncology Consult note [facility]  [phone] [phone]  Patient Care Team: Gayland Curry, MD as PCP - General (Family Medicine) Telford Nab, RN as Oncology Nurse Navigator   Name of the patient: Frederick Mitchell  [id number]  May 30, 1956   Date of visit: 07/25/2021   Interval history-history obtained with the help of sign language interpreter.  Patient continues to feel significantly fatigued.  Appetite is poor  ECOG PS- 3 Pain scale- 0   Review of systems- Review of Systems  Constitutional:  Positive for malaise/fatigue and weight loss.      No Known Allergies   Past Medical History:  Diagnosis Date   COPD (chronic obstructive pulmonary disease) ([place])    Deaf    Hypercholesteremia unk     Past Surgical History:  Procedure Laterality Date   EYE SURGERY      Social History   Socioeconomic History   Marital status: Married    Spouse name: Not on file   Number of children: Not on file   Years of education: Not on file   Highest education level: Not on file  Occupational History   Not on file  Tobacco Use   Smoking status: Every Day    Types: Cigarettes   Smokeless tobacco: Never  Substance and Sexual Activity   Alcohol use: No   Drug use: Not Currently   Sexual activity: Not Currently    Birth control/protection: None  Other Topics Concern   Not on file  Social History Narrative   Not on file   Social Determinants of Health   Financial Resource Strain: Not on file  Food Insecurity: Not on file  Transportation Needs: Not on file  Physical Activity: Not on file  Stress: Not on file  Social Connections: Not on file  Intimate Partner Violence: Not on file    Family History  Problem Relation Age of Onset   Lung cancer Father 81   Lung cancer Brother 65   COPD Maternal Grandmother 84   Heart failure Maternal Grandfather 4   Lung cancer Paternal Grandfather 45   Brain cancer Paternal  Uncle 44   Lung cancer Paternal Uncle 50       possible lung cancer with metastatic disease. not sure of origin.   Lung cancer Paternal Uncle 79     Current Facility-Administered Medications:    0.9 %  sodium chloride infusion, , Intravenous, Continuous, Ivor Costa, MD, Last Rate: 75 mL/hr at 07/25/21 4196, New Bag at 07/25/21 2229   acetaminophen (TYLENOL) tablet 650 mg, 650 mg, Oral, Q6H PRN, Ivor Costa, MD   albuterol (PROVENTIL) (2.5 MG/3ML) 0.083% nebulizer solution 2.5 mg, 2.5 mg, Nebulization, Q4H PRN, Ivor Costa, MD   arformoterol (BROVANA) nebulizer solution 15 mcg, 15 mcg, Nebulization, BID, [place] Eric J, DO   budesonide (PULMICORT) nebulizer solution 0.5 mg, 0.5 mg, Nebulization, BID, [place] Eric J, DO   ceFEPIme (MAXIPIME) 2 g in sodium chloride 0.9 % 100 mL IVPB, 2 g, Intravenous, Q8H, Rauer, Samantha O, RPH, Stopped at 07/25/21 1131   dextromethorphan-guaiFENesin (MUCINEX DM) 30-600 MG per 12 hr tablet 1 tablet, 1 tablet, Oral, BID PRN, Ivor Costa, MD   dextrose 50 % solution 50 mL, 50 mL, Intravenous, PRN, Ivor Costa, MD   doxepin (SINEQUAN) capsule 50 mg, 50 mg, Oral, QHS, Niu, Soledad Gerlach, MD, 50 mg at 07/25/21 0050   enoxaparin (LOVENOX) injection 40 mg, 40 mg, Subcutaneous, Q24H, Ivor Costa, MD, 40 mg at 07/25/21 [phone]  hydrOXYzine (ATARAX) tablet 10 mg, 10 mg, Oral, TID PRN, Ivor Costa, MD   ipratropium-albuterol (DUONEB) 0.5-2.5 (3) MG/3ML nebulizer solution 3 mL, 3 mL, Nebulization, Q4H, Ivor Costa, MD, 3 mL at 07/25/21 1151   ipratropium-albuterol (DUONEB) 0.5-2.5 (3) MG/3ML nebulizer solution 3 mL, 3 mL, Nebulization, Q6H PRN, [place] Eric J, DO   methylPREDNISolone sodium succinate (SOLU-MEDROL) 40 mg/mL injection 40 mg, 40 mg, Intravenous, Q12H, Ivor Costa, MD, 40 mg at 07/25/21 2992   nicotine (NICODERM CQ - dosed in mg/24 hours) patch 21 mg, 21 mg, Transdermal, Daily, Ivor Costa, MD   norepinephrine (LEVOPHED) 4mg  in 221mL (0.016 mg/mL) premix infusion, 0-40  mcg/min, Intravenous, Continuous, Ivor Costa, MD, Stopped at 07/25/21 0433   ondansetron (ZOFRAN) injection 4 mg, 4 mg, Intravenous, Q8H PRN, Ivor Costa, MD   vancomycin (VANCOREADY) IVPB 2000 mg/400 mL, 2,000 mg, Intravenous, Q24H, [place] Donnamarie Poag, DO  Current Outpatient Medications:    doxepin (SINEQUAN) 50 MG capsule, Take 50 mg by mouth at bedtime., Disp: , Rfl:    fluticasone (FLONASE) 50 MCG/ACT nasal spray, SHAKE LIQUID AND USE 2 SPRAYS IN EACH NOSTRIL EVERY DAY, Disp: , Rfl:    Fluticasone-Umeclidin-Vilant (TRELEGY ELLIPTA) 100-62.5-25 MCG/ACT AEPB, Inhale 2 sprays into the lungs 2 (two) times daily., Disp: , Rfl:    hydroxychloroquine (PLAQUENIL) 200 MG tablet, Take 200 mg by mouth 2 (two) times daily., Disp: , Rfl:    hydrOXYzine (ATARAX) 10 MG tablet, Take 10 mg by mouth 3 (three) times daily as needed., Disp: , Rfl:    Vitamin D, Ergocalciferol, (DRISDOL) 1.25 MG (50000 UNIT) CAPS capsule, Take 50,000 Units by mouth 3 (three) times a week., Disp: , Rfl:    carboxymethylcellulose (REFRESH PLUS) 0.5 % SOLN, Apply to eye. (Patient not taking: Reported on 07/23/2021), Disp: , Rfl:    clobetasol (TEMOVATE) 0.05 % external solution, Apply 1 application topically 2 (two) times daily. (Patient not taking: Reported on 07/23/2021), Disp: , Rfl:    pantoprazole (PROTONIX) 40 MG tablet, Take 1 tablet (40 mg total) by mouth daily. (Patient not taking: Reported on 07/23/2021), Disp: 30 tablet, Rfl: 0   predniSONE (DELTASONE) 10 MG tablet, Take 10 mg by mouth daily. (Patient not taking: Reported on 07/23/2021), Disp: , Rfl:    triamcinolone (KENALOG) 0.025 % cream, Apply 1 application topically 3 (three) times daily. (Patient not taking: Reported on 07/23/2021), Disp: , Rfl:   Physical exam:  Vitals:   07/25/21 0830 07/25/21 0930 07/25/21 1000 07/25/21 1030  BP: (!) 101/59 104/65 115/62 (!) 104/54  Pulse: 68 87 76 72  Resp: (!) 23 (!) 21 19 16   Temp:      TempSrc:      SpO2: 97% 94% 94% 97%   Weight:      Height:       Physical Exam Constitutional:      Comments: Appears thin and cachectic.  In no acute distress.  On oxygen  Cardiovascular:     Rate and Rhythm: Regular rhythm. Tachycardia present.     Heart sounds: Normal heart sounds.  Pulmonary:     Effort: Pulmonary effort is normal.     Breath sounds: Normal breath sounds.  Abdominal:     General: Bowel sounds are normal.     Palpations: Abdomen is soft.  Skin:    General: Skin is warm and dry.  Neurological:     Mental Status: He is alert and oriented to person, place, and time.     CMP Latest Ref Rng &  Units 07/25/2021  Glucose 70 - 99 mg/dL 140(H)  BUN 8 - 23 mg/dL 22  Creatinine 0.61 - 1.24 mg/dL 0.80  Sodium 135 - 145 mmol/L 133(L)  Potassium 3.5 - 5.1 mmol/L 3.6  Chloride 98 - 111 mmol/L 104  CO2 22 - 32 mmol/L 20(L)  Calcium 8.9 - 10.3 mg/dL 8.8(L)  Total Protein 6.5 - 8.1 g/dL -  Total Bilirubin 0.3 - 1.2 mg/dL -  Alkaline Phos 38 - 126 U/L -  AST 15 - 41 U/L -  ALT 0 - 44 U/L -   CBC Latest Ref Rng & Units 07/25/2021  WBC 4.0 - 10.5 K/uL 16.6(H)  Hemoglobin 13.0 - 17.0 g/dL 11.5(L)  Hematocrit 39.0 - 52.0 % 34.8(L)  Platelets 150 - 400 K/uL 147(L)    @IMAGES @  MR BRAIN W WO CONTRAST  Result Date: 07/24/2021 CLINICAL DATA:  Lung cancer staging EXAM: MRI HEAD WITHOUT AND WITH CONTRAST TECHNIQUE: Multiplanar, multiecho pulse sequences of the brain and surrounding structures were obtained without and with intravenous contrast. CONTRAST:  7.61mL GADAVIST GADOBUTROL 1 MMOL/ML IV SOLN COMPARISON:  None recent or similar FINDINGS: Brain: 9 mm cortically based mass in the left occipital lobe with mild vasogenic edema. Two adjacent cortically based lesions in the left frontal parietal region measuring 2 cm more anteriorly and 8 mm more posteriorly. Moderate vasogenic edema in the high left cerebrum. The largest lesion has chronic blood products. Asymmetric somewhat nodular thickening in the choroid  plexus at the right temporal lobe measuring 5 mm, indeterminate. Ill-defined thickening of the pituitary gland for age, measuring 11 mm craniocaudal. No acute infarct, hemorrhage, hydrocephalus, or collection. Vascular: Normal flow voids and vessel enhancements. Skull and upper cervical spine: Normal marrow signal Sinuses/Orbits: Negative IMPRESSION: Positive for cerebral metastatic disease with at least 3 lesions seen in the left cerebrum, measuring up to 2 cm at the frontal parietal junction where there is moderate vasogenic edema. Pituitary thickening and nodular thickening at the right choroid plexus could also be from metastatic disease, attention on follow-up. Electronically Signed   By: Jorje Guild M.D.   On: 07/24/2021 10:56   CT CHEST ABDOMEN PELVIS W CONTRAST  Result Date: 07/05/2021 CLINICAL DATA:  [name] presents for follow-up of suspected pulmonary neoplasm. EXAM: CT CHEST, ABDOMEN, AND PELVIS WITH CONTRAST TECHNIQUE: Multidetector CT imaging of the chest, abdomen and pelvis was performed following the standard protocol during bolus administration of intravenous contrast. CONTRAST:  176mL OMNIPAQUE IOHEXOL 300 MG/ML  SOLN COMPARISON:  Most recent comparison imaging of the chest are more remote chest x-rays. No direct comparison imaging is available with CT. Report from [place] center references a 1.6 x 1.00 cm pulmonary nodule in the LEFT upper lobe. FINDINGS: CT CHEST FINDINGS Cardiovascular: Calcified and noncalcified atheromatous plaque of the thoracic aorta. Normal heart size. Three-vessel coronary artery disease. Mild engorgement of central pulmonary vasculature. Mediastinum/Nodes: Diffuse adenopathy throughout the mediastinum with heterogeneous and necrotic appearance. LEFT paratracheal mass 3.8 x 3.3 cm (image 26/2) AP window lymph node 2.3 cm short axis. RIGHT paratracheal/peribronchial lymph node (image 30/2) 16 mm short axis. Cystic lesion in the anterior  mediastinum reported as benign on previous imaging, no comparison available. Pre-vascular lymph node adjacent to a LEFT upper lobe mass (image 20/2) pre-vascular lymph node at 13 mm. No thoracic inlet adenopathy. Is no axillary adenopathy. Lungs/Pleura: Numerous pulmonary nodules, too numerous to count. Largest in the LEFT upper lobe invading mediastinal fat measuring 3.6 x 3.0 cm, reportedly 1.6  x 1.0 cm on previous imaging assuming this is the same nodule. Central mass, more likely LEFT juxta hilar suprahilar adenopathy 3.0 x 3.4 cm (image 69/3) LEFT upper lobe pulmonary nodule 11 mm (image 73/3) Numerous additional pulmonary nodules on the LEFT and RIGHT. Largest in the LEFT lower lobe 2.6 x 2.3 cm. Largest in the RIGHT lower lobe (image 132/3) 2.1 cm. Airways are patent aside from LEFT upper lobe bronchials which are narrowed, narrowing of central bronchial structures. No sign of pleural effusion. No substantial septal thickening but some subtle nodularity along the fissure in the LEFT chest is noted. Musculoskeletal: Destructive and expansile rib lesion (image 50/3) 2.5 x 2.1 cm involving the LEFT posterior fifth rib. See below for full musculoskeletal details. CT ABDOMEN PELVIS FINDINGS Hepatobiliary: Surface nodularity along the entire surface of the RIGHT hemi liver peripherally and in more since pouch subcentimeter nodules throughout this location example seen on image 74 of series 2. No parenchymal lesion with suspicious features, small cysts suspected in the LEFT hepatic lobe. Lobular hepatic contours with fissural widening. Portal vein is patent. Pancreas: Normal, without mass, inflammation or ductal dilatation. Mass adjacent to the pancreas likely within the lesser sac. Extensive serosal and peritoneal nodularity throughout the abdomen, see below. No pancreatic ductal dilation or sign of inflammation. Spleen: Spleen normal size and contour. Adrenals/Urinary Tract: Signs of adrenal metastases  bilaterally. LEFT adrenal mass (image 61/2) 3.8 x 3.6 cm. RIGHT adrenal mass (image 60/2) 2.5 cm short axis. Infiltrative parenchymal involvement of the RIGHT and LEFT kidney, on the RIGHT 6.8 x 5.0 cm and on the LEFT 6.1 cm greatest axial dimension. Surface nodularity along the renal contour, on the RIGHT, for example 3.1 cm nodule along the upper pole the RIGHT kidney. Additional areas of heterogeneity in the kidneys smaller and more subtle. No hydronephrosis. Signs of nephrolithiasis with branched calculi, largest in the lower pole on the LEFT 2.7 x 1.8 cm. This extends into the infundibulum and peripheral aspect of the renal sinus. Two smaller calculi in the lower and interpolar LEFT kidney measuring up to 1.6 cm. Smaller calculi seen on the RIGHT largest 9 mm. Stomach/Bowel: Serosal involvement of the gastric antrum (image 69/2) 2.3 cm. Smaller areas of serosal nodularity along the stomach in this location tracking towards the pancreas. No signs of bowel obstruction or acute bowel process in the setting of extensive peritoneal disease Vascular/Lymphatic: Severe vascular disease. 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 Contrast seen distal to this level in the iliac vasculature. Heavily calcified iliac vessels and plicae that this is a chronic process. Bulky retroperitoneal adenopathy (image 69/2) retrocaval lymph node 2.1 cm. LEFT para-aortic adenopathy for example (image 71/2) 1.6 cm. Intra-aortocaval adenopathy (image 85/2) 1.3 cm. Scattered smaller lymph nodes throughout the retroperitoneum. No pelvic adenopathy. Reproductive: Unremarkable by CT. Other: Extensive peritoneal disease and omental nodularity. No ascites. For example anterior to the transverse colon on image 86/2 is a 2.9 cm peritoneal nodule. Surface nodularity in more since pouch along the under surface of the RIGHT hemi liver as described. Omental nodule in the LEFT upper quadrant (image 75/2) 3.2 cm.  Smaller nodules too numerous to count throughout the anterior abdomen elsewhere. Musculoskeletal: Destructive rib lesion in the LEFT chest as described. Lucent area in the LEFT inferior pubic ramus with ill-defined enhancement along the margin of the pubic bone at this level (image 121/2) 3.6 x 1.1 cm. Destructive LEFT acetabular lesion (image 109/2) 2.0 x 1.6 cm. Spinal degenerative  changes. IMPRESSION: Metastatic disease throughout the chest, abdomen and pelvis involving lungs, lymph nodes, viscera and bone as discussed. Distribution and appearance would favor pulmonary primary with metastatic disease. Bilateral adrenal and renal involvement. Infiltrative process in the bilateral kidneys could certainly be seen in the setting of pulmonary metastasis. Biopsy may be helpful given the diffuse nature of disease and the presence of bilateral infiltrative renal lesions. Comparison with prior imaging could also be helpful. Bilateral nephrolithiasis. Signs of extensive vascular disease with chronic appearing aorto iliac occlusive disease Three-vessel coronary artery disease. Aortic atherosclerosis. Aortic Atherosclerosis (ICD10-I70.0) and Emphysema (ICD10-J43.9). Electronically Signed   By: Zetta Bills M.D.   On: 07/05/2021 15:41   CT BIOPSY  Result Date: 07/09/2021 CLINICAL DATA:  Dominant left upper lobe/perihilar lung mass with multiple bilateral lung nodules, mediastinal lymphadenopathy, adrenal masses, renal masses and peritoneal masses. The patient presents for peritoneal biopsy to try to establish a tissue diagnosis. EXAM: CT GUIDED CORE BIOPSY OF PERITONEAL ABDOMINAL MASS ANESTHESIA/SEDATION: Formal conscious sedation was not administered as the patient is on oxygen and currently has hemoptysis. He was given 0.5 mg of IV Versed just prior to beginning the procedure for anxiolysis. PROCEDURE: The procedure risks, benefits, and alternatives were explained to the patient. Questions regarding the procedure  were encouraged and answered. The patient understands and consents to the procedure. A time-out was performed prior to initiating the procedure. CT was performed through the abdomen in a supine position. The left lateral abdominal wall was prepped with chlorhexidine in a sterile fashion, and a sterile drape was applied covering the operative field. A sterile gown and sterile gloves were used for the procedure. Local anesthesia was provided with 1% Lidocaine. Under CT guidance, a 17 gauge trocar needle was advanced into the lateral left peritoneal cavity lateral to small bowel and anterior to the descending colon. Coaxial 18 gauge core biopsy samples were then obtained of peritoneal masses. A total of 3 core biopsy samples were obtained and submitted in formalin. Gel-Foam pledgets were advanced through the outer needle prior to needle retraction and removal. Additional CT was performed. COMPLICATIONS: None FINDINGS: Multiple nodular peritoneal masses are again seen clustered in the lateral left peritoneal cavity at the level of the mid kidneys. Solid tissue samples were obtained. IMPRESSION: CT-guided core biopsy performed of left lateral peritoneal tumor. Electronically Signed   By: Aletta Edouard M.D.   On: 07/09/2021 14:44   DG Chest Port 1 View  Result Date: 07/23/2021 CLINICAL DATA:  Questionable sepsis. New lung cancer diagnosis with metastasis. Multiple recent falls. EXAM: PORTABLE CHEST 1 VIEW COMPARISON:  None. FINDINGS: Normal cardiac silhouette. Diffuse patchy airspace disease within LEFT and RIGHT lung. Underlying nodularity is less well-defined on the background of airspace disease. No focal consolidation. No pneumothorax. Findings worsened from comparison radiograph. Multiple pulmonary nodules on CT 07/05/2021. IMPRESSION: 1. Worsening pulmonary edema or pulmonary infection pattern. 2. Underlying bilateral pulmonary nodules consistent with pulmonary metastasis. 3. Consider head CT or MRI to  exclude brain metastasis with multiple recent falls and new metastatic lung cancer Electronically Signed   By: Suzy Bouchard M.D.   On: 07/23/2021 11:38   DG Chest Portable 1 View  Result Date: 07/05/2021 CLINICAL DATA:  Hemoptysis EXAM: PORTABLE CHEST 1 VIEW COMPARISON:  CT from earlier in the same day. FINDINGS: Cardiac shadow is within normal limits. Soft tissue density is noted in the medial aspect of the left apex consistent with the mass lesion seen on recent CT examination. Fullness in the  left hilum is again noted. Scattered pulmonary nodules are noted similar to that seen on prior CT. No acute bony abnormality is noted. Postsurgical changes in the cervical spine are noted. IMPRESSION: Changes similar to that seen on recent CT examination. No acute abnormality noted. Electronically Signed   By: Inez Catalina M.D.   On: 07/05/2021 21:24   CT Renal Stone Study  Result Date: 07/23/2021 CLINICAL DATA:  Flank pain, kidney stone suspected. Code sepsis. Hypotension. EXAM: CT ABDOMEN AND PELVIS WITHOUT CONTRAST TECHNIQUE: Multidetector CT imaging of the abdomen and pelvis was performed following the standard protocol without IV contrast. COMPARISON:  07/05/2021 FINDINGS: Lower chest: Heart is normal size. Coronary artery and aortic calcifications. Numerous bilateral lower lobe pulmonary nodules and masses. The largest is in the left lung base measuring up to 3.4 x 2.9 cm compared with 2.7 x 2.3 cm previously. Many other pulmonary nodules also appear enlarged since prior study. Trace bilateral pleural effusions. Hepatobiliary: Prior cholecystectomy. Small hypodensity in the left hepatic lobe likely reflects cyst. Prior cholecystectomy. Pancreas: No focal abnormality or ductal dilatation. Spleen: No focal abnormality.  Normal size. Adrenals/Urinary Tract: Bilateral adrenal masses are unchanged. Infiltrative appearing masses in both kidneys again noted, appear unchanged. Bilateral nephrolithiasis. No  ureteral stones or hydronephrosis. Small layering stones dependently within the urinary bladder. Bladder wall is unremarkable. Stomach/Bowel: No evidence of bowel obstruction. Vascular/Lymphatic: Aortoiliac atherosclerosis. Retroperitoneal adenopathy is stable. Reproductive: No visible focal abnormality. Other: Extensive peritoneal nodularity/disease and omental nodularity. This has progressed since prior study. Index left peritoneal nodule measures up to 3.7 cm compared to 3.2 cm previously. Other peritoneal/omental nodules appear larger. Musculoskeletal: Destructive left acetabular lesion an ill-defined left inferior pubic ramus lesion again noted, unchanged. IMPRESSION: Extensive metastatic disease in the chest, abdomen and bony pelvis as described above. This appears progressive with enlarging pulmonary nodules and omental/peritoneal nodules. Infiltrating masses within both kidneys are difficult to visualize without IV contrast, but likely similar to prior study. Bilateral nephrolithiasis. Small layering urinary bladder stones. No ureteral stones or hydronephrosis. Trace bilateral pleural effusions. Aortic atherosclerosis, coronary artery disease. Electronically Signed   By: Rolm Baptise M.D.   On: 07/23/2021 13:32     Assessment and plan- Patient is a 65 y.o. 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  Discussed with the patient that he has widely metastatic stage IV lung cancer which is not curable despite treatment.  He also has significant brain mets.  Currently his performance status is poor to tolerate any kind of systemic treatment.  Also ideally for brain metastases he would benefit from whole brain radiation treatment.  Patient and his wife understand  all their options.  They would not like to proceed with any systemic treatment or radiation treatment at this time.  They would like to focus on patient's comfort and quality of life and are willing to consider home hospice.  Palliative care will be having the discussion with them tomorrow as well.  Patient has 2 options moving forward: He can continue to get his present infection treated and after he gets better transition to home hospice versus discontinue treatment at this time and proceed with home hospice in 1 to 2 days.  Patient and his wife would like to think about this overnight and discuss further  tomorrow.  If patient is discharged home hospice he should stay on Decadron 2 mg twice daily given that he has vasogenic edema surrounding his brain metastases.  Septic shock secondary to bacillary septicemia: Currently on cefepime and vancomycin. Off levophed  On treatment for copd exacerbation.    Visit Diagnosis Metastatic lung cancer 1. Sepsis with encephalopathy and septic shock, due to unspecified organism ([place])   3. Goals of care counseling/ discussion   Dr. Randa Evens, MD, MPH [facility] [phone] 07/25/2021 3:01 PM

## 2021-07-25 NOTE — ED Notes (Signed)
Lab called at this time to confirm someone would come draw this pt's 0500 labs that have not been collected. Lab stated someone was on their way.

## 2021-07-25 NOTE — Progress Notes (Addendum)
PROGRESS NOTE    Frederick Mitchell  LOV:[id number] DOB: 1955/11/09 DOA: 07/23/2021 PCP: Gayland Curry, MD    Brief Narrative:  Frederick Mitchell is a [age]-year-old male with past medical history significant for deafness, hyperlipidemia, COPD not on oxygen at baseline, GERD, CAD, nephrolithiasis, lupus, dementia, recently diagnosed metastatic lung cancer who presented to [facility] ED on 12/26 with progressive shortness of breath.  History obtained from sign language interpreter.  Since recent discharge, he has had generalized weakness, malaise, cough, shortness of breath with fever/chills.  Reports fever 100.4 at home.  Notable for brownish sputum with several episodes of diarrhea.  Also with poor appetite and decreased oral intake.  Also reports dysuria.  Denies chest pain.  In the ED, patient was noted to be hypotensive with blood pressure 78/45 and Levophed drip was initiated by EDP.  PCCM, oncology, palliative care were consulted.  MRI brain notable for cerebral metastases.  Hospital service consulted for further evaluation management of septic shock secondary to likely pneumonia.   Assessment & Plan:   Principal Problem:   COPD exacerbation ([place]) Active Problems:   Coronary artery disease   Severe sepsis with septic shock (HCC)   HCAP (healthcare-associated pneumonia)   Lung cancer ([place])   Hypoglycemia   Lupus ([place])   Hyponatremia   Diarrhea   Dementia (HCC)   Tobacco abuse   Septic shock 2/2 pneumonia, suspect gram-negative versus atypical organism Bacillus septicemia Patient met sepsis criteria on admission with tachypnea, tachycardia, fever, lactic acid 2.3 in the setting of end organ damage MAP <70, INR >1.5 with source of infection likely secondary to pneumonia.  Culture with no growth. --Blood cultures 2 out of 4 positive for gram-negative rods with BCID Bacillis --MRSA PCR negative. --Continue cefepime and vancomycin --Levophed drip titrated off --Continue supplemental oxygen,  maintain SPO2 greater than 88%, on 4 L nasal cannula with SPO2 96% at rest  COPD exacerbation, not oxygen dependent at baseline On trelegy Ellipta at baseline. --Decadron 4 mg IV q8h --Brovana neb BID --Pulmicort neb BID --Albuterol/duo neb every 4 hours as needed wheezing/shortness of breath --Continue supplemental oxygen, maintain SPO2 greater than 88%  Lung cancer with metastasis of brain, bone, lymph nodes, visceral tissue, adrenal/renal involvement bilaterally Recent biopsy of peritoneal mass notable for metastatic adenocarcinoma compatible with lung origin.  Recent CT chest/abdomen/pelvis 12/8 with findings of metastatic disease throughout chest, abdomen, pelvis involving lungs, lymph nodes, visceral, bone with bilateral adrenal/renal involvement.  MR brain 12/27 with cerebral metastatic disease and moderate vasogenic edema. Seen by medical oncology, Dr. Rogue Bussing; not stable for treatment with chemotherapy or radiation currently given his poor functional status. --Decadron 4 mg IV q8h --Very poor prognosis, likely less than 6 months of life --Palliative care consulted, would benefit from home hospice; [facility] consulted --Supportive care  Hyponatremia Sodium 124, likely secondary to GI loss with diarrhea and poor oral intake versus lung cancer. --Continue to encourage increased oral intake --BMP daily  Dementia: Continue donepezil  Tobacco use disorder: Nicotine patch  Lupus: Holding home Plaquenil due to ongoing infection/severe sepsis.     DVT prophylaxis: enoxaparin (LOVENOX) injection 40 mg Start: 07/23/21 2200   Code Status: DNR Family Communication: Updated patient's spouse and sister present at bedside this morning with the use of video sign language interpreter  Disposition Plan:  Level of care: Stepdown Status is: Inpatient  Remains inpatient appropriate because: Continues on IV antibiotics, Levophed titrated off now earlier this morning.  Awaiting palliative care  consultation, likely will  be discharging on home hospice with very poor prognosis.    Consultants:  Medical oncology PCCM Palliative care  Procedures:  None  Antimicrobials:  Vancomycin 12/26 -12/27 Cefepime 12/26>> Azithromycin 12/26 - 12/26 Ceftriaxone 12/2 - 12/26  Anti-infectives (From admission, onward)    Start     Dose/Rate Route Frequency Ordered Stop   07/24/21 1400  vancomycin (VANCOREADY) IVPB 1500 mg/300 mL  Status:  Discontinued        1,500 mg 150 mL/hr over 120 Minutes Intravenous Every 24 hours 07/23/21 1742 07/24/21 1056   07/24/21 1400  vancomycin (VANCOREADY) IVPB 2000 mg/400 mL  Status:  Discontinued        2,000 mg 200 mL/hr over 120 Minutes Intravenous Every 24 hours 07/24/21 1056 07/25/21 1102   07/23/21 1800  ceFEPIme (MAXIPIME) 2 g in sodium chloride 0.9 % 100 mL IVPB        2 g 200 mL/hr over 30 Minutes Intravenous Every 8 hours 07/23/21 1742     07/23/21 1330  vancomycin (VANCOREADY) IVPB 1500 mg/300 mL        1,500 mg 150 mL/hr over 120 Minutes Intravenous  Once 07/23/21 1252 07/23/21 1625   07/23/21 1245  azithromycin (ZITHROMAX) 500 mg in sodium chloride 0.9 % 250 mL IVPB        500 mg 250 mL/hr over 60 Minutes Intravenous  Once 07/23/21 1235 07/23/21 1403   07/23/21 1100  cefTRIAXone (ROCEPHIN) 2 g in sodium chloride 0.9 % 100 mL IVPB  Status:  Discontinued        2 g 200 mL/hr over 30 Minutes Intravenous Every 24 hours 07/23/21 1056 07/23/21 1730         Subjective: Patient seen examined at bedside, resting comfortably.  Sister and wife present.  Aided in interpretation with video sign language interpreter, Georgina Snell ID# [id number].  She continues with weakness, cough and generalized ill feeling.  Levophed titrated off earlier this morning.  Sister adamant about aggressive care even though spouse and patient contemplating hospice care especially to return home.  Patient remains adamant on DNR/DNI status but wishes to continue antibiotics at this  time.  Awaiting palliative care consultation.  Discussed overall extremely poor prognosis with extensive metastatic lung cancer now including brain with vasogenic edema.  No other questions or concerns at this time.  Denies headache, no visual changes, no chest pain, no abdominal pain, no current fever/chills/night sweats, no nausea/vomiting.  No acute events overnight per nursing staff.  Objective: Vitals:   07/25/21 0830 07/25/21 0930 07/25/21 1000 07/25/21 1030  BP: (!) 101/59 104/65 115/62 (!) 104/54  Pulse: 68 87 76 72  Resp: (!) 23 (!) 21 19 16   Temp:      TempSrc:      SpO2: 97% 94% 94% 97%  Weight:      Height:       No intake or output data in the 24 hours ending 07/25/21 1411 Filed Weights   07/23/21 1047  Weight: 75 kg    Examination:  General exam: Appears calm and comfortable, chronically ill appearance Respiratory system: Coarse breath sounds bilaterally, mild late expiratory wheezing, normal [place] effort without accessory muscle use, on 4 L nasal cannula with SPO2 96% at rest Cardiovascular system: S1 & S2 heard, RRR. No JVD, murmurs, rubs, gallops or clicks. No pedal edema. Gastrointestinal system: Abdomen is nondistended, soft and nontender. No organomegaly or masses felt. Normal bowel sounds heard. Central nervous system: Alert and oriented. No focal neurological deficits. Extremities: Symmetric  5 x 5 power. Skin: No rashes, lesions or ulcers Psychiatry: Judgement and insight appear normal. Mood & affect appropriate.     Data Reviewed: I have personally reviewed following labs and imaging studies  CBC: Recent Labs  Lab 07/23/21 1051 07/24/21 0233 07/25/21 0843  WBC 11.4* 14.1* 16.6*  NEUTROABS 8.7*  --  13.2*  HGB 12.9* 12.0* 11.5*  HCT 38.3* 36.0* 34.8*  MCV 91.0 92.3 94.1  PLT 203 202 932*   Basic Metabolic Panel: Recent Labs  Lab 07/23/21 1051 07/24/21 0233 07/24/21 0922 07/25/21 0843  NA 124* 129* 139 133*  K 3.8 4.5 4.0 3.6  CL 93* 98  106 104  CO2 22 22 16* 20*  GLUCOSE 60* 151* 195* 140*  BUN 20 21 17 22   CREATININE 1.03 1.03 0.56* 0.80  CALCIUM 9.1 8.4* 6.6* 8.8*  MG  --   --   --  1.8   GFR: Estimated Creatinine Clearance: 83.8 mL/min (by C-G formula based on SCr of 0.8 mg/dL). Liver Function Tests: Recent Labs  Lab 07/23/21 1051  AST 140*  ALT 54*  ALKPHOS 99  BILITOT 0.9  PROT 7.0  ALBUMIN 2.7*   No results for input(s): LIPASE, AMYLASE in the last 168 hours. No results for input(s): AMMONIA in the last 168 hours. Coagulation Profile: Recent Labs  Lab 07/23/21 1051  INR 1.1   Cardiac Enzymes: No results for input(s): CKTOTAL, CKMB, CKMBINDEX, TROPONINI in the last 168 hours. BNP (last 3 results) No results for input(s): PROBNP in the last 8760 hours. HbA1C: No results for input(s): HGBA1C in the last 72 hours. CBG: Recent Labs  Lab 07/24/21 0812 07/25/21 0736  GLUCAP 204* 137*   Lipid Profile: No results for input(s): CHOL, HDL, LDLCALC, TRIG, CHOLHDL, LDLDIRECT in the last 72 hours. Thyroid Function Tests: No results for input(s): TSH, T4TOTAL, FREET4, T3FREE, THYROIDAB in the last 72 hours. Anemia Panel: No results for input(s): VITAMINB12, FOLATE, FERRITIN, TIBC, IRON, RETICCTPCT in the last 72 hours. Sepsis Labs: Recent Labs  Lab 07/23/21 1051 07/23/21 1222 07/23/21 1853 07/24/21 0233 07/24/21 0654  PROCALCITON  --   --   --   --  1.21  LATICACIDVEN 2.3* 2.0* 1.5 1.8  --     Recent Results (from the past 240 hour(s))  Blood Culture (routine x 2)     Status: None (Preliminary result)   Collection Time: 07/23/21 10:51 AM   Specimen: BLOOD  Result Value Ref Range Status   Specimen Description BLOOD RIGHT ANTECUBITAL  Final   Special Requests   Final    BOTTLES DRAWN AEROBIC AND ANAEROBIC Blood Culture adequate volume   Culture   Final    NO GROWTH 2 DAYS Performed at [facility], [address], [place], [place] [address]    Report Status PENDING  Incomplete   Urine Culture     Status: None   Collection Time: 07/23/21 10:51 AM   Specimen: In/Out Cath Urine  Result Value Ref Range Status   Specimen Description   Final    IN/OUT CATH URINE Performed at [facility], [address] [place], [place] [address]    Special Requests   Final    NONE Performed at [facility], [address], [place], [place] [address]    Culture   Final    NO GROWTH Performed at [facility], [place] [address] [place], [place] [address]    Report Status 07/24/2021 FINAL  Final  Blood Culture (  routine x 2)     Status: Abnormal (Preliminary result)   Collection Time: 07/23/21 10:53 AM   Specimen: BLOOD  Result Value Ref Range Status   Specimen Description   Final    BLOOD LEFT ANTECUBITAL Performed at [facility], [address], [place], [place] [address]    Special Requests   Final    BOTTLES DRAWN AEROBIC AND ANAEROBIC Blood Culture adequate volume Performed at [facility], [address], [place], [place] [address]    Culture  Setup Time   Final    GRAM POSITIVE RODS CRITICAL RESULT CALLED TO, READ BACK BY AND VERIFIED WITH: NATHAN BELUE @ 4081 ON 07/23/21.Marland KitchenMarland KitchenTKR IN BOTH AEROBIC AND ANAEROBIC BOTTLES Performed at [facility], [address] [place], [place] [address]    Culture (A)  Final    BACILLUS SPECIES Standardized susceptibility testing for this organism is not available. CULTURE REINCUBATED FOR BETTER GROWTH Performed at [facility], [place] [address], [place], [place] [address]    Report Status PENDING  Incomplete  Resp Panel by RT-PCR (Flu A&B, Covid) Nasopharyngeal Swab     Status: None   Collection Time: 07/23/21 10:56 AM   Specimen: Nasopharyngeal Swab; Nasopharyngeal(NP) swabs in vial transport medium  Result Value Ref Range Status   SARS Coronavirus 2 by RT PCR NEGATIVE NEGATIVE Final    Comment: (NOTE) SARS-CoV-2 target nucleic acids are NOT DETECTED.  The SARS-CoV-2  RNA is generally detectable in upper respiratory specimens during the acute phase of infection. The lowest concentration of SARS-CoV-2 viral copies this assay can detect is 138 copies/mL. A negative result does not preclude SARS-Cov-2 infection and should not be used as the sole basis for treatment or other patient management decisions. A negative result may occur with  improper specimen collection/handling, submission of specimen other than nasopharyngeal swab, presence of viral mutation(s) within the areas targeted by this assay, and inadequate number of viral copies(<138 copies/mL). A negative result must be combined with clinical observations, patient history, and epidemiological information. The expected result is Negative.  Fact Sheet for Patients:  EntrepreneurPulse.com.au  Fact Sheet for Healthcare Providers:  IncredibleEmployment.be  This test is no t yet approved or cleared by the [place] FDA and  has been authorized for detection and/or diagnosis of SARS-CoV-2 by FDA under an Emergency Use Authorization (EUA). This EUA will remain  in effect (meaning this test can be used) for the duration of the COVID-19 declaration under Section 564(b)(1) of the Act, 21 U.S.C.section 360bbb-3(b)(1), unless the authorization is terminated  or revoked sooner.       Influenza A by PCR NEGATIVE NEGATIVE Final   Influenza B by PCR NEGATIVE NEGATIVE Final    Comment: (NOTE) The Xpert Xpress SARS-CoV-2/FLU/RSV plus assay is intended as an aid in the diagnosis of influenza from Nasopharyngeal swab specimens and should not be used as a sole basis for treatment. Nasal washings and aspirates are unacceptable for Xpert Xpress SARS-CoV-2/FLU/RSV testing.  Fact Sheet for Patients: EntrepreneurPulse.com.au  Fact Sheet for Healthcare Providers: IncredibleEmployment.be  This test is not yet approved or cleared by the  [place] FDA and has been authorized for detection and/or diagnosis of SARS-CoV-2 by FDA under an Emergency Use Authorization (EUA). This EUA will remain in effect (meaning this test can be used) for the duration of the COVID-19 declaration under Section 564(b)(1) of the Act, 21 U.S.C. section 360bbb-3(b)(1), unless the authorization is terminated or revoked.  Performed at [facility], [phone] [place], [place] [address]   MRSA Next Gen by PCR, Nasal     Status: None   Collection Time: 07/25/21  8:29 AM   Specimen: Nasal Mucosa; Nasal Swab  Result Value Ref Range Status   MRSA by PCR Next Gen NOT DETECTED NOT DETECTED Final    Comment: (NOTE) The GeneXpert MRSA Assay (FDA approved for NASAL specimens only), is one component of a comprehensive MRSA colonization surveillance program. It is not intended to diagnose MRSA infection nor to guide or monitor treatment for MRSA infections. Test performance is not FDA approved in patients less than 37 years old. Performed at [facility], [address] [place],  [address]          Radiology Studies: MR BRAIN W WO CONTRAST  Result Date: 07/24/2021 CLINICAL DATA:  Lung cancer staging EXAM: MRI HEAD WITHOUT AND WITH CONTRAST TECHNIQUE: Multiplanar, multiecho pulse sequences of the brain and surrounding structures were obtained without and with intravenous contrast. CONTRAST:  7.78m GADAVIST GADOBUTROL 1 MMOL/ML IV SOLN COMPARISON:  None recent or similar FINDINGS: Brain: 9 mm cortically based mass in the left occipital lobe with mild vasogenic edema. Two adjacent cortically based lesions in the left frontal parietal region measuring 2 cm more anteriorly and 8 mm more posteriorly. Moderate vasogenic edema in the high left cerebrum. The largest lesion has chronic blood products. Asymmetric somewhat nodular thickening in the choroid plexus at the right temporal lobe measuring 5 mm, indeterminate.  Ill-defined thickening of the pituitary gland for age, measuring 11 mm craniocaudal. No acute infarct, hemorrhage, hydrocephalus, or collection. Vascular: Normal flow voids and vessel enhancements. Skull and upper cervical spine: Normal marrow signal Sinuses/Orbits: Negative IMPRESSION: Positive for cerebral metastatic disease with at least 3 lesions seen in the left cerebrum, measuring up to 2 cm at the frontal parietal junction where there is moderate vasogenic edema. Pituitary thickening and nodular thickening at the right choroid plexus could also be from metastatic disease, attention on follow-up. Electronically Signed   By: JJorje GuildM.D.   On: 07/24/2021 10:56        Scheduled Meds:  doxepin  50 mg Oral QHS   enoxaparin (LOVENOX) injection  40 mg Subcutaneous Q24H   ipratropium-albuterol  3 mL Nebulization Q4H   methylPREDNISolone (SOLU-MEDROL) injection  40 mg Intravenous Q12H   nicotine  21 mg Transdermal Daily   Continuous Infusions:  sodium chloride 75 mL/hr at 07/25/21 0622   ceFEPime (MAXIPIME) IV Stopped (07/25/21 1131)   norepinephrine (LEVOPHED) Adult infusion Stopped (07/25/21 0433)     LOS: 2 days    Time spent: 49 minutes spent on chart review, discussion with nursing staff, consultants, updating family and interview/physical exam; more than 50% of that time was spent in counseling and/or coordination of care.    Stina Gane J ABritish [place] DO [facility] Available via Epic secure chat 7am-7pm After these hours, please refer to coverage provider listed on [email or web address] 07/25/2021, 2:11 PM

## 2021-07-25 NOTE — ED Notes (Signed)
This RN called lab at this time to request they send someone to come draw this pt's 0500 labs. Lab stated they would send someone at this time.

## 2021-07-25 NOTE — ED Notes (Signed)
Crystal,NP with palliative care came to see pt. Per pt wishes his wife to be here to talk with her. RN notified pt wife and asked her to be with pt at 0900 tomorrow.

## 2021-07-25 NOTE — Plan of Care (Signed)
PMT note:  Consult request noted. Patient is sitting in ED bed. No family at bedside. Nurse and phlebotomy at bedside working with patient, and tele interpretor was on and translating. Patient indicates his wife is not here and does not want to have any discussion without her present at bedside. Advised I would try to reach her to arrange a meeting in the morning around 9 or 10am as he states she should be here in the morning.   Attempted to call wife, and was connected to an interpretor through her phone number. No answer. Left a VM through phone interpretor that I would return in the morning at 9 or 10 to meet with them. Will reach out tomorrow morning if that does not work for them.

## 2021-07-25 NOTE — ED Notes (Signed)
MD at bedside speaking with pt and pt's wife.

## 2021-07-25 NOTE — ED Notes (Addendum)
Per secure chat with Dr. Sidney Ace and Stark Klein, NP, pt is transitioning to comfort care, ordered to titrate levo drip down and d/c; per v/o by Stark Klein, NP with MAP is below 65 do not restart levo

## 2021-07-26 ENCOUNTER — Ambulatory Visit: Payer: Medicare HMO | Admitting: Radiation Oncology

## 2021-07-26 DIAGNOSIS — C3490 Malignant neoplasm of unspecified part of unspecified bronchus or lung: Secondary | ICD-10-CM | POA: Diagnosis not present

## 2021-07-26 DIAGNOSIS — Z720 Tobacco use: Secondary | ICD-10-CM

## 2021-07-26 DIAGNOSIS — J189 Pneumonia, unspecified organism: Secondary | ICD-10-CM | POA: Diagnosis not present

## 2021-07-26 DIAGNOSIS — M329 Systemic lupus erythematosus, unspecified: Secondary | ICD-10-CM | POA: Diagnosis not present

## 2021-07-26 DIAGNOSIS — Z515 Encounter for palliative care: Secondary | ICD-10-CM

## 2021-07-26 DIAGNOSIS — Z66 Do not resuscitate: Secondary | ICD-10-CM

## 2021-07-26 DIAGNOSIS — J441 Chronic obstructive pulmonary disease with (acute) exacerbation: Secondary | ICD-10-CM | POA: Diagnosis not present

## 2021-07-26 LAB — BASIC METABOLIC PANEL
Anion gap: 10 (ref 5–15)
BUN: 23 mg/dL (ref 8–23)
CO2: 18 mmol/L — ABNORMAL LOW (ref 22–32)
Calcium: 8.4 mg/dL — ABNORMAL LOW (ref 8.9–10.3)
Chloride: 105 mmol/L (ref 98–111)
Creatinine, Ser: 0.85 mg/dL (ref 0.61–1.24)
GFR, Estimated: >60 mL/min (ref >60)
Glucose, Bld: 151 mg/dL — ABNORMAL HIGH (ref 70–99)
Potassium: 3.4 mmol/L — ABNORMAL LOW (ref 3.5–5.1)
Sodium: 133 mmol/L — ABNORMAL LOW (ref 135–145)

## 2021-07-26 LAB — PROCALCITONIN: Procalcitonin: 0.5 ng/mL

## 2021-07-26 LAB — CBC
HCT: 32.7 % — ABNORMAL LOW (ref 39.0–52.0)
Hemoglobin: 10.7 g/dL — ABNORMAL LOW (ref 13.0–17.0)
MCH: 30.2 pg (ref 26.0–34.0)
MCHC: 32.7 g/dL (ref 30.0–36.0)
MCV: 92.4 fL (ref 80.0–100.0)
Platelets: 143 10*3/uL — ABNORMAL LOW (ref 150–400)
RBC: 3.54 MIL/uL — ABNORMAL LOW (ref 4.22–5.81)
RDW: 15.6 % — ABNORMAL HIGH (ref 11.5–15.5)
WBC: 13.1 10*3/uL — ABNORMAL HIGH (ref 4.0–10.5)
nRBC: 0 % (ref 0.0–0.2)

## 2021-07-26 LAB — MAGNESIUM: Magnesium: 1.8 mg/dL (ref 1.7–2.4)

## 2021-07-26 LAB — CBG MONITORING, ED: Glucose-Capillary: 125 mg/dL — ABNORMAL HIGH (ref 70–99)

## 2021-07-26 MED ORDER — AMOXICILLIN-POT CLAVULANATE 875-125 MG PO TABS: 1.0000 | ORAL_TABLET | Freq: Two times a day (BID) | ORAL | 0 refills | Status: AC

## 2021-07-26 MED ORDER — DEXAMETHASONE 2 MG PO TABS: 2.0000 mg | ORAL_TABLET | Freq: Two times a day (BID) | ORAL | 2 refills | Status: AC

## 2021-07-26 MED ORDER — IPRATROPIUM-ALBUTEROL 0.5-2.5 (3) MG/3ML IN SOLN
3.0000 mL | RESPIRATORY_TRACT | 2 refills | Status: AC | PRN
Start: 2021-07-26 — End: 2021-10-24

## 2021-07-26 MED ORDER — DEXAMETHASONE SODIUM PHOSPHATE 4 MG/ML IJ SOLN: 4.0000 mg | Freq: Once | INTRAMUSCULAR | Status: DC

## 2021-07-26 NOTE — Consult Note (Signed)
Consultation Note Date: 07/26/2021   Patient Name: Frederick Mitchell  DOB: 1956/01/25  MRN: [id number]  Age / Sex: 65 y.o., male  PCP: Gayland Curry, MD Referring Physician: [place] Eric J, DO  Reason for Consultation: Establishing goals of care  HPI/Patient Profile: 65 y.o. male  with past medical history of deafness, HLD, COPD (no supplemental oxygen at baseline), GERD, CAD, nephrolithiasis, lupus, dementia, and recently diagnosed metastatic lung cancer admitted on 07/23/2021 with worsening shortness of breath.   MRI on 12/27 reveals multiple metastatic lesions in his brain.  Palliative medicine team was consulted to discuss goals of care.  Clinical Assessment and Goals of Care: I have reviewed medical records including EPIC notes, labs and imaging, assessed the patient and then met with patient, patient's wife, and patient's sister-in-law to discuss diagnosis prognosis, GOC, EOL wishes, disposition and options.  Patient and patient's wife are deaf.  Patient's sister-in-law is not deaf and can speak in sign language.  The sign language interpreter (virtual on IPad) was used for the entirety of this discussion.  I introduced Palliative Medicine as specialized medical care for people living with serious illness. It focuses on providing relief from the symptoms and stress of a serious illness. The goal is to improve quality of life for both the patient and the family.  We discussed a brief life review of the patient.  Patient and his wife met 21 years ago when he was buying a pack of cigarettes from his now sister-in-law.  He was writing down what he wanted when the sister-in-law started to sign to him.  Sister-in-law was encouraging her sister to get out more.  She connected the patient with her sister, they were married 3 months later, and have been at each other side for the last 21 years.  Shares that she  loves her husband very much and that he is a very funny man.  As far as functional and nutritional status patient was independent with ADLs prior to this hospitalization.  We discussed patient's current illness and what it means in the larger context of patient's on-going co-morbidities.  Natural disease trajectory and expectations at EOL were discussed.  I attempted to elicit values and goals of care important to the patient.  Patient and patient's wife share that they do not want to move forward with chemotherapy.  They would like to be able to go home and have care for him focused on comfort.  Hospice in the home reviewed in detail.  I outlined that DME as well as all medications will be handled by hospice.  Patient will no longer be receiving aggressive medical treatment but only treatment focused on his symptoms and ensuring compassionate, dignified and comfort measures.  Patient, patient's wife, and sister-in-law are in agreement they would like to move forward with full comfort measures and have hospice assist with patient's care in the home.  Dr. [place] [facility] social worker, nurse, and hospice liaison made aware of patient's wishes.  Questions and concerns were addressed. The family was  encouraged to call with questions or concerns.   Primary Decision Maker PATIENT  Code Status/Advance Care Planning: DNR  Prognosis:   < 6 months  Discharge Planning: Home with Hospice  Primary Diagnoses: Present on Admission:  Severe sepsis with septic shock ([place])  (Resolved) COPD (chronic obstructive pulmonary disease) (HCC)  COPD exacerbation (HCC)  Coronary artery disease  HCAP (healthcare-associated pneumonia)  Lung cancer ([place])  Hypoglycemia  Lupus ([place])  Hyponatremia  Diarrhea  Dementia ([place])  Tobacco abuse   Physical Exam Vitals and nursing note reviewed.  Constitutional:      Appearance: Normal appearance.  HENT:     Head: Normocephalic and atraumatic.     Nose: Nose  normal.     Mouth/Throat:     Mouth: Mucous membranes are moist.  Eyes:     Pupils: Pupils are equal, round, and reactive to light.  Cardiovascular:     Rate and Rhythm: Normal rate.     Pulses: Normal pulses.  Pulmonary:     Effort: Pulmonary effort is normal.     Comments: Powell in place Abdominal:     Palpations: Abdomen is soft.  Musculoskeletal:     Comments: Generalized weakness  Skin:    General: Skin is warm and dry.  Neurological:     Mental Status: He is alert. Mental status is at baseline.  Psychiatric:        Mood and Affect: Mood normal.        Behavior: Behavior normal.        Thought Content: Thought content normal.        Judgment: Judgment normal.    Vital Signs: BP (!) 119/55    Pulse 92    Temp 98.5 F (36.9 C) (Axillary)    Resp 17    Ht _0  (1.702 m)    Wt 75 kg    SpO2 97%    BMI 25.90 kg/m  Pain Scale: 0-10   Pain Score: 0-No pain SpO2: SpO2: 97 % O2 Device:SpO2: 97 % O2 Flow Rate: .O2 Flow Rate (L/min): 4 L/min  Palliative Assessment/Data:40%     Thank you for this consult. Palliative medicine will continue to follow and assist holistically.   Time Total: 70 minutes Greater than 50%  of this time was spent counseling and coordinating care related to the above assessment and plan.  Signed by: Jordan Hawks, DNP, FNP-BC Palliative Medicine    Please contact Palliative Medicine Team phone at [phone] for questions and concerns.  For individual provider: See Shea Evans

## 2021-07-26 NOTE — TOC Initial Note (Signed)
Transition of Care [facility]) - Initial/Assessment Note    Patient Details  Name: Frederick Mitchell MRN: [id number] Date of Birth: 1955-08-04  Transition of Care [facility]) CM/SW Contact:    Shelbie Hutching, RN Phone Number: 07/26/2021, 1:25 PM  Clinical Narrative:                 Patient admitted to the hospital with septic shock, patient has lung cancer with mets not on oxygen at home and just recently diagnosed so no chemo started. Patient is currently in the ED, no beds on the floors, acute O2 [place] 4 L.  RNCM met with patient and patient's wife at the bedside using the I pad for ASL interpreter services.  Wife and patient both deaf.  They express that they want to take the patient home with hospice services and that they will need a hospital bed and oxygen, she would also like a bedside table.  Hospice agency choice offered, they have no preference so they choose [facility].  Sonia Baller and Santiago Glad with [facility] given referral for home hospice.  If equipment can be delivered today then patient can discharge home today via EMS.    Expected Discharge Plan: Home w Hospice Care Barriers to Discharge: Equipment Delay   Patient Goals and CMS Choice Patient states their goals for this hospitalization and ongoing recovery are:: Family would like for patient to discharge home with hospice services. CMS Medicare.gov Compare Post Acute Care list provided to:: Patient Represenative (must comment) Choice offered to / list presented to : Spouse  Expected Discharge Plan and Services Expected Discharge Plan: [place]   Discharge Planning Services: CM Consult Post Acute Care Choice: Hospice Living arrangements for the past 2 months: Mobile Home                 DME Arranged: Hospital bed, Oxygen, Other see comment (over the bed table) DME Agency: Hospice and [place] Date DME Agency Contacted: 07/26/21 Time DME Agency Contacted: [place] Representative spoke with at DME Agency: Flo Shanks [facility] Arranged: NA          Prior Living Arrangements/Services Living arrangements for the past 2 months: Mobile Home Lives with:: Spouse Patient language and need for interpreter reviewed:: Yes Do you feel safe going back to the place where you live?: Yes      Need for Family Participation in Patient Care: Yes (Comment) Care giver support system in place?: Yes (comment) (wife and sister in law)   Criminal Activity/Legal Involvement Pertinent to Current Situation/Hospitalization: No - Comment as needed  Activities of Daily Living      Permission Sought/Granted Permission sought to share information with : Case Manager, Customer service manager, Family Supports Permission granted to share information with : Yes, Verbal Permission Granted  Share Information with NAME: Frederick Mitchell  Permission granted to share info w AGENCY: [place] granted to share info w Relationship: spouse  Permission granted to share info w Contact Information: [phone]  Emotional Assessment Appearance:: Appears older than stated age Attitude/Demeanor/Rapport: Lethargic Affect (typically observed): Quiet   Alcohol / Substance Use: Not Applicable Psych Involvement: No (comment)  Admission diagnosis:  Severe sepsis ([place]) [A41.9, R65.20] HCAP (healthcare-associated pneumonia) [J18.9] Patient Active Problem List   Diagnosis Date Noted   Severe sepsis with septic shock ([place]) 07/23/2021   HCAP (healthcare-associated pneumonia) 07/23/2021   COPD exacerbation ([place]) 07/23/2021   Lung cancer ([place]) 07/23/2021   Hypoglycemia 07/23/2021  Lupus ([place]) 07/23/2021   Hyponatremia 07/23/2021   Diarrhea 07/23/2021   Dementia ([place]) 07/23/2021   Tobacco abuse 07/23/2021   Malignant neoplasm of overlapping sites of lung [facility])    Multiple lesions of metastatic malignancy ([place])    Bone metastases ([place])    Abnormal CT scan    Coronary artery disease 07/05/2021   Hemoptysis  07/05/2021   Abnormal CT of the chest 07/05/2021   Hematuria 07/05/2021   Nephrolithiasis 07/05/2021   Abnormal LFTs 07/05/2021   PCP:  Gayland Curry, MD Pharmacy:   [id number] [id number]: [phone]: [phone]    Social Determinants of Health (SDOH) Interventions    Readmission Risk Interventions Readmission Risk Prevention Plan 07/26/2021  Transportation Screening Complete  PCP or Specialist Appt within 5-7 Days Complete  Home Care Screening Complete  Medication Review (RN CM) Complete  Some recent data might be hidden

## 2021-07-26 NOTE — ED Notes (Signed)
[place] DO at bedside

## 2021-07-26 NOTE — Progress Notes (Signed)
[facility]) Hospital Liaison Note  DME is to be delivered by 6 pm today. Doran Clay, RN [facility] manager and sister-in-law Melissa aware.    Please send signed and completed DNR home with patient/family. Please provide prescriptions at discharge as needed to ensure ongoing symptom management.    Please do not hesitate to call with any hospice related questions or concerns.    Thank you,    Nadene Rubins, RN, BSN [facility] Liaison [phone]

## 2021-07-26 NOTE — ED Notes (Signed)
Lab at bedside

## 2021-07-26 NOTE — ED Notes (Signed)
Pt resting with eyes closed, respirations even and unlabored.

## 2021-07-26 NOTE — ED Notes (Signed)
Hospice RN at [facility].

## 2021-07-26 NOTE — ED Notes (Addendum)
Pt resting in bed with eyes closed, respiration even and unlabored.  Wife resting in recliner at bedside.

## 2021-07-26 NOTE — TOC Transition Note (Signed)
Transition of Care [facility]) - CM/SW Discharge Note   Patient Details  Name: Frederick Mitchell MRN: [id number] Date of Birth: 1956-05-06  Transition of Care [facility]) CM/SW Contact:  Shelbie Hutching, RN Phone Number: 07/26/2021, 5:07 PM   Clinical Narrative:    [place] accepted hospice referral, Equipment to be delivered to the home by 6 pm today.  Emergency room secretary has called to set up EMS transport for patient home this evening.     Final next level of care: Home w Hospice Care Barriers to Discharge: Barriers Resolved   Patient Goals and CMS Choice Patient states their goals for this hospitalization and ongoing recovery are:: Family would like for patient to discharge home with hospice services. CMS Medicare.gov Compare Post Acute Care list provided to:: Patient Represenative (must comment) Choice offered to / list presented to : Spouse  Discharge Placement                Patient to be transferred to facility by: Transfer to home via EMS Name of family member notified: family aware Patient and family notified of of transfer: 07/26/21  Discharge Plan and Services   Discharge Planning Services: CM Consult Post Acute Care Choice: Hospice          DME Arranged: Hospital bed, Oxygen, Other see comment (over the bed table) DME Agency: Hospice and [place] Date DME Agency Contacted: 07/26/21 Time DME Agency Contacted: [place] Representative spoke with at DME Agency: Flo Shanks [facility] Arranged: NA          Social Determinants of Health ([place]) Interventions     Readmission Risk Interventions Readmission Risk Prevention Plan 07/26/2021  Transportation Screening Complete  PCP or Specialist Appt within 5-7 Days Complete  Home Care Screening Complete  Medication Review (RN CM) Complete  Some recent data might be hidden

## 2021-07-26 NOTE — ED Notes (Signed)
Pt resting comfortably in bed, NAD. No needs identified at this time. Bed low & locked; call light & personal items within reach.

## 2021-07-26 NOTE — ED Notes (Addendum)
[place] NP at bedside

## 2021-07-26 NOTE — ED Notes (Signed)
Warm blankets given to pt per his request.

## 2021-07-26 NOTE — Progress Notes (Signed)
PROGRESS NOTE    Frederick Mitchell  HEN:[id number] DOB: June 23, 1956 DOA: 07/23/2021 PCP: Gayland Curry, MD    Brief Narrative:  Frederick Mitchell is a 65 year old male with past medical history significant for deafness, hyperlipidemia, COPD not on oxygen at baseline, GERD, CAD, nephrolithiasis, lupus, dementia, recently diagnosed metastatic lung cancer who [facility] with progressive shortness of breath.  History obtained from sign language interpreter.  Since recent discharge, he has had generalized weakness, malaise, cough, shortness of breath with fever/chills.  Reports fever 100.4 at home.  Notable for brownish sputum with several episodes of diarrhea.  Also with poor appetite and decreased oral intake.  Also reports dysuria.  Denies chest pain.  In the ED, patient was noted to be hypotensive with blood pressure 78/45 and Levophed drip was initiated by EDP.  PCCM, oncology, palliative care were consulted.  MRI brain notable for cerebral metastases.  Hospital service consulted for further evaluation management of septic shock secondary to likely pneumonia.   Assessment & Plan:   Principal Problem:   COPD exacerbation ([place]) Active Problems:   Coronary artery disease   Severe sepsis with septic shock (HCC)   HCAP (healthcare-associated pneumonia)   Lung cancer ([place])   Hypoglycemia   Lupus ([place])   Hyponatremia   Diarrhea   Dementia (H[facility])   Tobacco abuse   Septic shock 2/2 pneumonia, suspect gram-negative versus atypical organism Bacillus septicemia; presumed contaminant Patient met sepsis criteria on admission with tachypnea, tachycardia, fever, lactic acid 2.3 in the setting of end organ damage MAP <70, INR >1.5 with source of infection likely secondary to pneumonia.  Culture with no growth. --Blood cultures 2 out of 4 positive for gram-negative rods with BCID Bacillis --MRSA PCR negative. --Continue cefepime 2g IV q8h --Levophed drip titrated off --Continue  supplemental oxygen, maintain SPO2 greater than 88%, on 4 L nasal cannula with SPO2 96% at rest  COPD exacerbation, not oxygen dependent at baseline On trelegy Ellipta at baseline. --Decadron 4 mg IV q8h --Brovana neb BID --Pulmicort neb BID --Albuterol/duo neb q4h PRN wheezing/shortness of breath --Continue supplemental oxygen, maintain SPO2 greater than 88%  Lung cancer with metastasis of brain, bone, lymph nodes, visceral tissue, adrenal/renal involvement bilaterally Recent biopsy of peritoneal mass notable for metastatic adenocarcinoma compatible with lung origin.  Recent CT chest/abdomen/pelvis 12/8 with findings of metastatic disease throughout chest, abdomen, pelvis involving lungs, lymph nodes, visceral, bone with bilateral adrenal/renal involvement.  MR brain 12/27 with cerebral metastatic disease and moderate vasogenic edema. Seen by medical oncology, Dr. Rogue Bussing; not stable for treatment with chemotherapy or radiation currently given his poor functional status. --Decadron 4 mg IV q8h; will plan 70m PO BID on discharge --Very poor prognosis, likely less than 6 months of life --Palliative care consulted, would benefit from home hospice; [facility] --Supportive care  Hyponatremia Sodium 124, likely secondary to GI loss with diarrhea and poor oral intake versus lung cancer. --Na 124>129>139>133>133 --NS at 768mh --Continue to encourage increased oral intake  Dementia: Continue donepezil  Tobacco use disorder: Nicotine patch  Lupus: Holding home Plaquenil due to ongoing infection/severe sepsis.     DVT prophylaxis: enoxaparin (LOVENOX) injection 40 mg Start: 07/23/21 2200   Code Status: DNR Family Communication: Updated patient's spouse and sister present at bedside this morning with the use of video sign language interpreter  Disposition Plan:  Level of care: Stepdown Status is: Inpatient  Remains inpatient appropriate because: Continues on IV antibiotics,  Levophed titrated off now earlier  this morning.  Awaiting palliative care consultation, likely will be discharging on home hospice with very poor prognosis.    Consultants:  Medical oncology PCCM Palliative care  Procedures:  None  Antimicrobials:  Vancomycin 12/26 -12/27 Cefepime 12/26>> Azithromycin 12/26 - 12/26 Ceftriaxone 12/2 - 12/26    Subjective: Patient seen examined at bedside, resting comfortably.  Spouse present.  Aided in interpretation with sign language interpreter, [name] [phone].  Patient and spouse wanting to proceed with home hospice.  Awaiting palliative care evaluation today and hospice evaluation.  No other questions or concerns at this time and appreciative all the care he is receiving at [facility].  Denies headache, no visual changes, no chest pain, no abdominal pain, no current fever/chills/night sweats, no nausea/vomiting.  No acute events overnight per nursing staff.  Objective: Vitals:   07/26/21 1100 07/26/21 1130 07/26/21 1200 07/26/21 1230  BP: (!) 108/57 112/87 (!) 110/55 (!) 119/55  Pulse: 90 89 92 92  Resp: (!) 23 17 19 17   Temp:      TempSrc:      SpO2: 98% 96% 98% 97%  Weight:      Height:        Intake/Output Summary (Last 24 hours) at 07/26/2021 1251 Last data filed at 07/26/2021 1022 Gross per 24 hour  Intake 100 ml  Output 2100 ml  Net -2000 ml   Filed Weights   07/23/21 1047  Weight: 75 kg    Examination:  General exam: Appears calm and comfortable, chronically ill appearance Respiratory system: Coarse breath sounds bilaterally, mild late expiratory wheezing, normal Respaire effort without accessory muscle use, on 4 L nasal cannula with SPO2 96% at rest Cardiovascular system: S1 & S2 heard, RRR. No JVD, murmurs, rubs, gallops or clicks. No pedal edema. Gastrointestinal system: Abdomen is nondistended, soft and nontender. No organomegaly or masses felt. Normal bowel sounds heard. Central nervous system: Alert and  oriented. No focal neurological deficits. Extremities: Symmetric 5 x 5 power. Skin: No rashes, lesions or ulcers Psychiatry: Judgement and insight appear normal. Mood & affect appropriate.     Data Reviewed: I have personally reviewed following labs and imaging studies  CBC: Recent Labs  Lab 07/23/21 1051 07/24/21 0233 07/25/21 0843 07/26/21 0852  WBC 11.4* 14.1* 16.6* 13.1*  NEUTROABS 8.7*  --  13.2*  --   HGB 12.9* 12.0* 11.5* 10.7*  HCT 38.3* 36.0* 34.8* 32.7*  MCV 91.0 92.3 94.1 92.4  PLT 203 202 147* 209*   Basic Metabolic Panel: Recent Labs  Lab 07/23/21 1051 07/24/21 0233 07/24/21 0922 07/25/21 0843 07/26/21 0852  NA 124* 129* 139 133* 133*  K 3.8 4.5 4.0 3.6 3.4*  CL 93* 98 106 104 105  CO2 22 22 16* 20* 18*  GLUCOSE 60* 151* 195* 140* 151*  BUN 20 21 17 22 23   CREATININE 1.03 1.03 0.56* 0.80 0.85  CALCIUM 9.1 8.4* 6.6* 8.8* 8.4*  MG  --   --   --  1.8 1.8   GFR: Estimated Creatinine Clearance: 78.8 mL/min (by C-G formula based on SCr of 0.85 mg/dL). Liver Function Tests: Recent Labs  Lab 07/23/21 1051  AST 140*  ALT 54*  ALKPHOS 99  BILITOT 0.9  PROT 7.0  ALBUMIN 2.7*   No results for input(s): LIPASE, AMYLASE in the last 168 hours. No results for input(s): AMMONIA in the last 168 hours. Coagulation Profile: Recent Labs  Lab 07/23/21 1051  INR 1.1   Cardiac Enzymes: No results for input(s): CKTOTAL, CKMB,  CKMBINDEX, TROPONINI in the last 168 hours. BNP (last 3 results) No results for input(s): PROBNP in the last 8760 hours. HbA1C: No results for input(s): HGBA1C in the last 72 hours. CBG: Recent Labs  Lab 07/24/21 0812 07/25/21 0736 07/26/21 0951  GLUCAP 204* 137* 125*   Lipid Profile: No results for input(s): CHOL, HDL, LDLCALC, TRIG, CHOLHDL, LDLDIRECT in the last 72 hours. Thyroid Function Tests: No results for input(s): TSH, T4TOTAL, FREET4, T3FREE, THYROIDAB in the last 72 hours. Anemia Panel: No results for input(s):  VITAMINB12, FOLATE, FERRITIN, TIBC, IRON, RETICCTPCT in the last 72 hours. Sepsis Labs: Recent Labs  Lab 07/23/21 1051 07/23/21 1222 07/23/21 1853 07/24/21 0233 07/24/21 0654 07/26/21 0852  PROCALCITON  --   --   --   --  1.21 0.50  LATICACIDVEN 2.3* 2.0* 1.5 1.8  --   --     Recent Results (from the past 240 hour(s))  Blood Culture (routine x 2)     Status: None (Preliminary result)   Collection Time: 07/23/21 10:51 AM   Specimen: BLOOD  Result Value Ref Range Status   Specimen Description BLOOD RIGHT ANTECUBITAL  Final   Special Requests   Final    BOTTLES DRAWN AEROBIC AND ANAEROBIC Blood Culture adequate volume   Culture   Final    NO GROWTH 3 DAYS Performed at [facility], [address], [place], [place] [address]    Report Status PENDING  Incomplete  Urine Culture     Status: None   Collection Time: 07/23/21 10:51 AM   Specimen: In/Out Cath Urine  Result Value Ref Range Status   Specimen Description   Final    IN/OUT CATH URINE Performed at [facility], [address] [place], [place] [address]    Special Requests   Final    NONE Performed at [facility], [address] [place], [place] [address]    Culture   Final    NO GROWTH Performed at [facility], [place] [address], [place], [place] [address]    Report Status 07/24/2021 FINAL  Final  Blood Culture (routine x 2)     Status: Abnormal (Preliminary result)   Collection Time: 07/23/21 10:53 AM   Specimen: BLOOD  Result Value Ref Range Status   Specimen Description   Final    BLOOD LEFT ANTECUBITAL Performed at [facility], [address], [place], [place] [address]    Special Requests   Final    BOTTLES DRAWN AEROBIC AND ANAEROBIC Blood Culture adequate volume Performed at [facility], [address], [place], [place] [address]    Culture  Setup Time   Final    GRAM POSITIVE RODS CRITICAL RESULT CALLED TO, READ BACK BY AND VERIFIED WITH:  NATHAN BELUE @ 2993 ON 07/23/21.Marland KitchenMarland KitchenTKR IN BOTH AEROBIC AND ANAEROBIC BOTTLES Performed at [facility], [address] [place], [place] [address]    Culture (A)  Final    BACILLUS SPECIES Standardized susceptibility testing for this organism is not available. CULTURE REINCUBATED FOR BETTER GROWTH Performed at [facility], [place] [address], [place], [place] [address]    Report Status PENDING  Incomplete  Resp Panel by RT-PCR (Flu A&B, Covid) Nasopharyngeal Swab     Status: None   Collection Time: 07/23/21 10:56 AM   Specimen: Nasopharyngeal Swab; Nasopharyngeal(NP) swabs in vial transport medium  Result Value Ref Range Status   SARS Coronavirus 2 by RT PCR NEGATIVE NEGATIVE Final  Comment: (NOTE) SARS-CoV-2 target nucleic acids are NOT DETECTED.  The SARS-CoV-2 RNA is generally detectable in upper respiratory specimens during the acute phase of infection. The lowest concentration of SARS-CoV-2 viral copies this assay can detect is 138 copies/mL. A negative result does not preclude SARS-Cov-2 infection and should not be used as the sole basis for treatment or other patient management decisions. A negative result may occur with  improper specimen collection/handling, submission of specimen other than nasopharyngeal swab, presence of viral mutation(s) within the areas targeted by this assay, and inadequate number of viral copies(<138 copies/mL). A negative result must be combined with clinical observations, patient history, and epidemiological information. The expected result is Negative.  Fact Sheet for Patients:  EntrepreneurPulse.com.au  Fact Sheet for Healthcare Providers:  IncredibleEmployment.be  This test is no t yet approved or cleared by the [place] FDA and  has been authorized for detection and/or diagnosis of SARS-CoV-2 by FDA under an Emergency Use Authorization (EUA). This EUA will remain  in effect (meaning  this test can be used) for the duration of the COVID-19 declaration under Section 564(b)(1) of the Act, 21 U.S.C.section 360bbb-3(b)(1), unless the authorization is terminated  or revoked sooner.       Influenza A by PCR NEGATIVE NEGATIVE Final   Influenza B by PCR NEGATIVE NEGATIVE Final    Comment: (NOTE) The Xpert Xpress SARS-CoV-2/FLU/RSV plus assay is intended as an aid in the diagnosis of influenza from Nasopharyngeal swab specimens and should not be used as a sole basis for treatment. Nasal washings and aspirates are unacceptable for Xpert Xpress SARS-CoV-2/FLU/RSV testing.  Fact Sheet for Patients: EntrepreneurPulse.com.au  Fact Sheet for Healthcare Providers: IncredibleEmployment.be  This test is not yet approved or cleared by the [place] FDA and has been authorized for detection and/or diagnosis of SARS-CoV-2 by FDA under an Emergency Use Authorization (EUA). This EUA will remain in effect (meaning this test can be used) for the duration of the COVID-19 declaration under Section 564(b)(1) of the Act, 21 U.S.C. section 360bbb-3(b)(1), unless the authorization is terminated or revoked.  Performed at [facility], [address] [place] [place], [place] [address]   MRSA Next Gen by PCR, Nasal     Status: None   Collection Time: 07/25/21  8:29 AM   Specimen: Nasal Mucosa; Nasal Swab  Result Value Ref Range Status   MRSA by PCR Next Gen NOT DETECTED NOT DETECTED Final    Comment: (NOTE) The GeneXpert MRSA Assay (FDA approved for NASAL specimens only), is one component of a comprehensive MRSA colonization surveillance program. It is not intended to diagnose MRSA infection nor to guide or monitor treatment for MRSA infections. Test performance is not FDA approved in patients less than 18 years old. Performed at [facility], [address] [place], [place] [address]   CULTURE, BLOOD (ROUTINE X 2) w Reflex to ID  Panel     Status: None (Preliminary result)   Collection Time: 07/25/21  3:30 PM   Specimen: BLOOD  Result Value Ref Range Status   Specimen Description BLOOD RIGHT ANTECUBITAL  Final   Special Requests   Final    BOTTLES DRAWN AEROBIC AND ANAEROBIC Blood Culture adequate volume   Culture   Final    NO GROWTH < 12 HOURS Performed at [facility], [address] [place], [place] [address]    Report Status PENDING  Incomplete  CULTURE, BLOOD (ROUTINE X 2) w Reflex to ID Panel     Status:  None (Preliminary result)   Collection Time: 07/25/21  3:33 PM   Specimen: BLOOD  Result Value Ref Range Status   Specimen Description BLOOD BLOOD RIGHT HAND  Final   Special Requests   Final    BOTTLES DRAWN AEROBIC AND ANAEROBIC Blood Culture adequate volume   Culture   Final    NO GROWTH < 12 HOURS Performed at [facility], [address] [place], [place] [address]    Report Status PENDING  Incomplete         Radiology Studies: No results found.      Scheduled Meds:  arformoterol  15 mcg Nebulization BID   budesonide (PULMICORT) nebulizer solution  0.5 mg Nebulization BID   dexamethasone (DECADRON) injection  4 mg Intravenous Q8H   doxepin  50 mg Oral QHS   enoxaparin (LOVENOX) injection  40 mg Subcutaneous Q24H   ipratropium-albuterol  3 mL Nebulization Q4H   nicotine  21 mg Transdermal Daily   Continuous Infusions:  sodium chloride 75 mL/hr at 07/26/21 0232   ceFEPime (MAXIPIME) IV Stopped (07/26/21 1022)   norepinephrine (LEVOPHED) Adult infusion Stopped (07/25/21 0433)     LOS: 3 days    Time spent: 39 minutes spent on chart review, discussion with nursing staff, consultants, updating family and interview/physical exam; more than 50% of that time was spent in counseling and/or coordination of care.    Tameko Halder J [place] DO [facility] Available via Epic secure chat 7am-7pm After these hours, please refer to coverage provider listed on  [email or web address] 07/26/2021, 12:51 PM

## 2021-07-26 NOTE — ED Notes (Signed)
Pt resting comfortably in bed, NAD. No needs identified at this time. Call light handed to pt. Bed low & locked; personal items & call light within reach. Wife is going home.

## 2021-07-26 NOTE — Progress Notes (Addendum)
[facility]) Hospital Liaison Note  Received request from Transitions of Care Manager Doran Clay, RN, for hospice services at home after discharge. Chart and patient information reviewed by [facility] physician. Hospice eligibility confirmed.  Visited patient at bedside and spoke with spouse Frederick Mitchell via ASL video conference to initiate education related to hospice philosophy, services and team approach to care. Patient/family verbalized understanding of information provided. Per discussion, the plan for discharge is once DME is delivered via EMS transport.  DME needs discussed. Patient has the following equipment in the home: rolling walker and cane. Patient/family requests the following equipment for delivery: hospital bed with 1/2 rails, BSC, nebulizer machine, oxygen, OBT and suction machine with yankauer. Address has been verified and is correct in the chart. Frederick Mitchell, sister-in-law at [phone] is the family contact to arrange time of equipment delivery.   Please send signed and completed DNR home with patient/family. Please provide prescriptions at discharge as needed to ensure ongoing symptom management.   [facility] information and contact numbers given to family. Above information shared with [place].   Please do not hesitate to call with any hospice related questions or concerns.   Thank you for the opportunity to participate in this patient's care.   Nadene Rubins, RN, BSN [facility] Liaison [phone]

## 2021-07-26 NOTE — Discharge Summary (Signed)
Physician Discharge Summary  Frederick Mitchell SPQ:[id number] DOB: 07/29/1956 DOA: 07/23/2021  PCP: Gayland Curry, MD  Admit date: 07/23/2021 Discharge date: 07/26/2021  Admitted From: Home Disposition: Home with hospice  Recommendations for Outpatient Follow-up:  Follow up with PCP, medical oncology, hospice provider on discharge  Home Health:  Equipment/Devices: Hospital bed, home nebulizer, oxygen, suction  Discharge Condition: Stable but overall guarded with poor prognosis CODE STATUS: DNR Diet recommendation: Regular diet  History of present illness:  Frederick Mitchell is a 65 year old male with past medical history significant for deafness, hyperlipidemia, COPD not on oxygen at baseline, GERD, CAD, nephrolithiasis, lupus, dementia, recently diagnosed metastatic lung cancer who presented to [facility] ED on 12/26 with progressive shortness of breath.  History obtained from sign language interpreter.  Since recent discharge, he has had generalized weakness, malaise, cough, shortness of breath with fever/chills.  Reports fever 100.4 at home.  Notable for brownish sputum with several episodes of diarrhea.  Also with poor appetite and decreased oral intake.  Also reports dysuria.  Denies chest pain.   In the ED, patient was noted to be hypotensive with blood pressure 78/45 and Levophed drip was initiated by EDP.  PCCM, oncology, palliative care were consulted.  MRI brain notable for cerebral metastases.  Hospital service consulted for further evaluation management of septic shock secondary to likely pneumonia.  Hospital course:  Septic shock 2/2 pneumonia, suspect gram-negative vs atypical organism Bacillus septicemia; presumed contaminant Patient met sepsis criteria on admission with tachypnea, tachycardia, fever, lactic acid 2.3 in the setting of end organ damage MAP <70, INR >1.5 with source of infection likely secondary to pneumonia.  Culture with no growth. Blood cultures 2 out of 4 positive  for gram-negative rods with BCID Bacillis; likely contaminant.  MRSA PCR was negative.  Initially started on broad-spectrum antibiotics with vancomycin and cefepime which was de-escalated to cefepime.  Levophed drip was titrated off.  Patient will discharge on Augmentin 875-125 mg p.o. twice daily to complete 7-day antibiotic course.  Will discharge with home oxygen.   COPD exacerbation, not oxygen dependent at baseline On trelegy Ellipta at baseline.  Continue Decadron 2 mg p.o. twice daily.  DuoNebs as needed for shortness of breath/wheezing.  Discharging on home oxygen   Lung cancer with metastasis of brain, bone, lymph nodes, visceral tissue, adrenal/renal involvement bilaterally Recent biopsy of peritoneal mass notable for metastatic adenocarcinoma compatible with lung origin.  Recent CT chest/abdomen/pelvis 12/8 with findings of metastatic disease throughout chest, abdomen, pelvis involving lungs, lymph nodes, visceral, bone with bilateral adrenal/renal involvement.  MR brain 12/27 with cerebral metastatic disease and moderate vasogenic edema. Seen by medical oncology, Dr. Rogue Bussing; not stable for treatment with chemotherapy or radiation currently given his poor functional status.  Continue Decadron 2 mg p.o. twice daily.  Ultimately very poor prognosis with less than 6 months of life remaining.  Discharging with home hospice.   Hyponatremia Sodium 124, likely secondary to GI loss with diarrhea and poor oral intake versus lung cancer.  Patient was started IV fluid hydration with improvement of sodium to 133 at time of discharge.  Continue to encourage increase oral intake.   Dementia: Continue donepezil   Tobacco use disorder: Nicotine patch   Lupus: Holding home Plaquenil due to ongoing infection/severe sepsis.    Discharge Diagnoses:  Principal Problem:   COPD exacerbation ([place]) Active Problems:   Coronary artery disease   HCAP (healthcare-associated pneumonia)   Lung cancer ([place])    Hypoglycemia   Lupus ([place])  Hyponatremia   Dementia ([place])   Tobacco abuse    Discharge Instructions  Discharge Instructions     Diet - low sodium heart healthy   Complete by: As directed    Increase activity slowly   Complete by: As directed       Allergies as of 07/26/2021   No Known Allergies      Medication List     STOP taking these medications    carboxymethylcellulose 0.5 % Soln Commonly known as: REFRESH PLUS   clobetasol 0.05 % external solution Commonly known as: TEMOVATE   hydroxychloroquine 200 MG tablet Commonly known as: PLAQUENIL   nicotine 21 mg/24hr patch Commonly known as: NICODERM CQ - dosed in mg/24 hours   pantoprazole 40 MG tablet Commonly known as: Protonix   predniSONE 10 MG tablet Commonly known as: DELTASONE   triamcinolone 0.025 % cream Commonly known as: KENALOG       TAKE these medications    amoxicillin-clavulanate 875-125 MG tablet Commonly known as: Augmentin Take 1 tablet by mouth 2 (two) times daily for 5 days.   dexamethasone 2 MG tablet Commonly known as: DECADRON Take 1 tablet (2 mg total) by mouth 2 (two) times daily.   doxepin 50 MG capsule Commonly known as: SINEQUAN Take 50 mg by mouth at bedtime.   fluticasone 50 MCG/ACT nasal spray Commonly known as: FLONASE SHAKE LIQUID AND USE 2 SPRAYS IN EACH NOSTRIL EVERY DAY   hydrOXYzine 10 MG tablet Commonly known as: ATARAX Take 10 mg by mouth 3 (three) times daily as needed.   ipratropium-albuterol 0.5-2.5 (3) MG/3ML Soln Commonly known as: DUONEB Take 3 mLs by nebulization every 4 (four) hours as needed.   Trelegy Ellipta 100-62.5-25 MCG/ACT Aepb Generic drug: Fluticasone-Umeclidin-Vilant Inhale 2 sprays into the lungs 2 (two) times daily.   Vitamin D (Ergocalciferol) 1.25 MG (50000 UNIT) Caps capsule Commonly known as: DRISDOL Take 50,000 Units by mouth 3 (three) times a week.        Follow-up Information     Gayland Curry, MD.  Schedule an appointment as soon as possible for a visit in 1 week(s).   Specialty: Family Medicine Contact information: [place] [address] [phone]                No Known Allergies  Consultations: Palliative care Medical oncology PCCM   Procedures/Studies: MR BRAIN W WO CONTRAST  Result Date: 07/24/2021 CLINICAL DATA:  Lung cancer staging EXAM: MRI HEAD WITHOUT AND WITH CONTRAST TECHNIQUE: Multiplanar, multiecho pulse sequences of the brain and surrounding structures were obtained without and with intravenous contrast. CONTRAST:  7.46m GADAVIST GADOBUTROL 1 MMOL/ML IV SOLN COMPARISON:  None recent or similar FINDINGS: Brain: 9 mm cortically based mass in the left occipital lobe with mild vasogenic edema. Two adjacent cortically based lesions in the left frontal parietal region measuring 2 cm more anteriorly and 8 mm more posteriorly. Moderate vasogenic edema in the high left cerebrum. The largest lesion has chronic blood products. Asymmetric somewhat nodular thickening in the choroid plexus at the right temporal lobe measuring 5 mm, indeterminate. Ill-defined thickening of the pituitary gland for age, measuring 11 mm craniocaudal. No acute infarct, hemorrhage, hydrocephalus, or collection. Vascular: Normal flow voids and vessel enhancements. Skull and upper cervical spine: Normal marrow signal Sinuses/Orbits: Negative IMPRESSION: Positive for cerebral metastatic disease with at least 3 lesions seen in the left cerebrum, measuring up to 2 cm at the frontal parietal junction where there is moderate vasogenic edema.  Pituitary thickening and nodular thickening at the right choroid plexus could also be from metastatic disease, attention on follow-up. Electronically Signed   By: Jorje Guild M.D.   On: 07/24/2021 10:56   CT CHEST ABDOMEN PELVIS W CONTRAST  Result Date: 07/05/2021 CLINICAL DATA:  [name] presents for follow-up of suspected pulmonary  neoplasm. EXAM: CT CHEST, ABDOMEN, AND PELVIS WITH CONTRAST TECHNIQUE: Multidetector CT imaging of the chest, abdomen and pelvis was performed following the standard protocol during bolus administration of intravenous contrast. CONTRAST:  151m OMNIPAQUE IOHEXOL 300 MG/ML  SOLN COMPARISON:  Most recent comparison imaging of the chest are more remote chest x-rays. No direct comparison imaging is available with CT. Report from [facility] references a 1.6 x 1.00 cm pulmonary nodule in the LEFT upper lobe. FINDINGS: CT CHEST FINDINGS Cardiovascular: Calcified and noncalcified atheromatous plaque of the thoracic aorta. Normal heart size. Three-vessel coronary artery disease. Mild engorgement of central pulmonary vasculature. Mediastinum/Nodes: Diffuse adenopathy throughout the mediastinum with heterogeneous and necrotic appearance. LEFT paratracheal mass 3.8 x 3.3 cm (image 26/2) AP window lymph node 2.3 cm short axis. RIGHT paratracheal/peribronchial lymph node (image 30/2) 16 mm short axis. Cystic lesion in the anterior mediastinum reported as benign on previous imaging, no comparison available. Pre-vascular lymph node adjacent to a LEFT upper lobe mass (image 20/2) pre-vascular lymph node at 13 mm. No thoracic inlet adenopathy. Is no axillary adenopathy. Lungs/Pleura: Numerous pulmonary nodules, too numerous to count. Largest in the LEFT upper lobe invading mediastinal fat measuring 3.6 x 3.0 cm, reportedly 1.6 x 1.0 cm on previous imaging assuming this is the same nodule. Central mass, more likely LEFT juxta hilar suprahilar adenopathy 3.0 x 3.4 cm (image 69/3) LEFT upper lobe pulmonary nodule 11 mm (image 73/3) Numerous additional pulmonary nodules on the LEFT and RIGHT. Largest in the LEFT lower lobe 2.6 x 2.3 cm. Largest in the RIGHT lower lobe (image 132/3) 2.1 cm. Airways are patent aside from LEFT upper lobe bronchials which are narrowed, narrowing of central bronchial structures. No sign of  pleural effusion. No substantial septal thickening but some subtle nodularity along the fissure in the LEFT chest is noted. Musculoskeletal: Destructive and expansile rib lesion (image 50/3) 2.5 x 2.1 cm involving the LEFT posterior fifth rib. See below for full musculoskeletal details. CT ABDOMEN PELVIS FINDINGS Hepatobiliary: Surface nodularity along the entire surface of the RIGHT hemi liver peripherally and in more since pouch subcentimeter nodules throughout this location example seen on image 74 of series 2. No parenchymal lesion with suspicious features, small cysts suspected in the LEFT hepatic lobe. Lobular hepatic contours with fissural widening. Portal vein is patent. Pancreas: Normal, without mass, inflammation or ductal dilatation. Mass adjacent to the pancreas likely within the lesser sac. Extensive serosal and peritoneal nodularity throughout the abdomen, see below. No pancreatic ductal dilation or sign of inflammation. Spleen: Spleen normal size and contour. Adrenals/Urinary Tract: Signs of adrenal metastases bilaterally. LEFT adrenal mass (image 61/2) 3.8 x 3.6 cm. RIGHT adrenal mass (image 60/2) 2.5 cm short axis. Infiltrative parenchymal involvement of the RIGHT and LEFT kidney, on the RIGHT 6.8 x 5.0 cm and on the LEFT 6.1 cm greatest axial dimension. Surface nodularity along the renal contour, on the RIGHT, for example 3.1 cm nodule along the upper pole the RIGHT kidney. Additional areas of heterogeneity in the kidneys smaller and more subtle. No hydronephrosis. Signs of nephrolithiasis with branched calculi, largest in the lower pole on the LEFT 2.7 x 1.8  cm. This extends into the infundibulum and peripheral aspect of the renal sinus. Two smaller calculi in the lower and interpolar LEFT kidney measuring up to 1.6 cm. Smaller calculi seen on the RIGHT largest 9 mm. Stomach/Bowel: Serosal involvement of the gastric antrum (image 69/2) 2.3 cm. Smaller areas of serosal nodularity along the stomach  in this location tracking towards the pancreas. No signs of bowel obstruction or acute bowel process in the setting of extensive peritoneal disease Vascular/Lymphatic: Severe vascular disease. 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 Contrast seen distal to this level in the iliac vasculature. Heavily calcified iliac vessels and plicae that this is a chronic process. Bulky retroperitoneal adenopathy (image 69/2) retrocaval lymph node 2.1 cm. LEFT para-aortic adenopathy for example (image 71/2) 1.6 cm. Intra-aortocaval adenopathy (image 85/2) 1.3 cm. Scattered smaller lymph nodes throughout the retroperitoneum. No pelvic adenopathy. Reproductive: Unremarkable by CT. Other: Extensive peritoneal disease and omental nodularity. No ascites. For example anterior to the transverse colon on image 86/2 is a 2.9 cm peritoneal nodule. Surface nodularity in more since pouch along the under surface of the RIGHT hemi liver as described. Omental nodule in the LEFT upper quadrant (image 75/2) 3.2 cm. Smaller nodules too numerous to count throughout the anterior abdomen elsewhere. Musculoskeletal: Destructive rib lesion in the LEFT chest as described. Lucent area in the LEFT inferior pubic ramus with ill-defined enhancement along the margin of the pubic bone at this level (image 121/2) 3.6 x 1.1 cm. Destructive LEFT acetabular lesion (image 109/2) 2.0 x 1.6 cm. Spinal degenerative changes. IMPRESSION: Metastatic disease throughout the chest, abdomen and pelvis involving lungs, lymph nodes, viscera and bone as discussed. Distribution and appearance would favor pulmonary primary with metastatic disease. Bilateral adrenal and renal involvement. Infiltrative process in the bilateral kidneys could certainly be seen in the setting of pulmonary metastasis. Biopsy may be helpful given the diffuse nature of disease and the presence of bilateral infiltrative renal lesions. Comparison with prior imaging  could also be helpful. Bilateral nephrolithiasis. Signs of extensive vascular disease with chronic appearing aorto iliac occlusive disease Three-vessel coronary artery disease. Aortic atherosclerosis. Aortic Atherosclerosis (ICD10-I70.0) and Emphysema (ICD10-J43.9). Electronically Signed   By: Zetta Bills M.D.   On: 07/05/2021 15:41   CT BIOPSY  Result Date: 07/09/2021 CLINICAL DATA:  Dominant left upper lobe/perihilar lung mass with multiple bilateral lung nodules, mediastinal lymphadenopathy, adrenal masses, renal masses and peritoneal masses. The patient presents for peritoneal biopsy to try to establish a tissue diagnosis. EXAM: CT GUIDED CORE BIOPSY OF PERITONEAL ABDOMINAL MASS ANESTHESIA/SEDATION: Formal conscious sedation was not administered as the patient is on oxygen and currently has hemoptysis. He was given 0.5 mg of IV Versed just prior to beginning the procedure for anxiolysis. PROCEDURE: The procedure risks, benefits, and alternatives were explained to the patient. Questions regarding the procedure were encouraged and answered. The patient understands and consents to the procedure. A time-out was performed prior to initiating the procedure. CT was performed through the abdomen in a supine position. The left lateral abdominal wall was prepped with chlorhexidine in a sterile fashion, and a sterile drape was applied covering the operative field. A sterile gown and sterile gloves were used for the procedure. Local anesthesia was provided with 1% Lidocaine. Under CT guidance, a 29 gauge trocar needle was advanced into the lateral left peritoneal cavity lateral to small bowel and anterior to the descending colon. Coaxial 18 gauge core biopsy samples were then obtained of peritoneal masses. A total  of 3 core biopsy samples were obtained and submitted in formalin. Gel-Foam pledgets were advanced through the outer needle prior to needle retraction and removal. Additional CT was performed.  COMPLICATIONS: None FINDINGS: Multiple nodular peritoneal masses are again seen clustered in the lateral left peritoneal cavity at the level of the mid kidneys. Solid tissue samples were obtained. IMPRESSION: CT-guided core biopsy performed of left lateral peritoneal tumor. Electronically Signed   By: Aletta Edouard M.D.   On: 07/09/2021 14:44   DG Chest Port 1 View  Result Date: 07/23/2021 CLINICAL DATA:  Questionable sepsis. New lung cancer diagnosis with metastasis. Multiple recent falls. EXAM: PORTABLE CHEST 1 VIEW COMPARISON:  None. FINDINGS: Normal cardiac silhouette. Diffuse patchy airspace disease within LEFT and RIGHT lung. Underlying nodularity is less well-defined on the background of airspace disease. No focal consolidation. No pneumothorax. Findings worsened from comparison radiograph. Multiple pulmonary nodules on CT 07/05/2021. IMPRESSION: 1. Worsening pulmonary edema or pulmonary infection pattern. 2. Underlying bilateral pulmonary nodules consistent with pulmonary metastasis. 3. Consider head CT or MRI to exclude brain metastasis with multiple recent falls and new metastatic lung cancer Electronically Signed   By: Suzy Bouchard M.D.   On: 07/23/2021 11:38   DG Chest Portable 1 View  Result Date: 07/05/2021 CLINICAL DATA:  Hemoptysis EXAM: PORTABLE CHEST 1 VIEW COMPARISON:  CT from earlier in the same day. FINDINGS: Cardiac shadow is within normal limits. Soft tissue density is noted in the medial aspect of the left apex consistent with the mass lesion seen on recent CT examination. Fullness in the left hilum is again noted. Scattered pulmonary nodules are noted similar to that seen on prior CT. No acute bony abnormality is noted. Postsurgical changes in the cervical spine are noted. IMPRESSION: Changes similar to that seen on recent CT examination. No acute abnormality noted. Electronically Signed   By: Inez Catalina M.D.   On: 07/05/2021 21:24   CT Renal Stone Study  Result Date:  07/23/2021 CLINICAL DATA:  Flank pain, kidney stone suspected. Code sepsis. Hypotension. EXAM: CT ABDOMEN AND PELVIS WITHOUT CONTRAST TECHNIQUE: Multidetector CT imaging of the abdomen and pelvis was performed following the standard protocol without IV contrast. COMPARISON:  07/05/2021 FINDINGS: Lower chest: Heart is normal size. Coronary artery and aortic calcifications. Numerous bilateral lower lobe pulmonary nodules and masses. The largest is in the left lung base measuring up to 3.4 x 2.9 cm compared with 2.7 x 2.3 cm previously. Many other pulmonary nodules also appear enlarged since prior study. Trace bilateral pleural effusions. Hepatobiliary: Prior cholecystectomy. Small hypodensity in the left hepatic lobe likely reflects cyst. Prior cholecystectomy. Pancreas: No focal abnormality or ductal dilatation. Spleen: No focal abnormality.  Normal size. Adrenals/Urinary Tract: Bilateral adrenal masses are unchanged. Infiltrative appearing masses in both kidneys again noted, appear unchanged. Bilateral nephrolithiasis. No ureteral stones or hydronephrosis. Small layering stones dependently within the urinary bladder. Bladder wall is unremarkable. Stomach/Bowel: No evidence of bowel obstruction. Vascular/Lymphatic: Aortoiliac atherosclerosis. Retroperitoneal adenopathy is stable. Reproductive: No visible focal abnormality. Other: Extensive peritoneal nodularity/disease and omental nodularity. This has progressed since prior study. Index left peritoneal nodule measures up to 3.7 cm compared to 3.2 cm previously. Other peritoneal/omental nodules appear larger. Musculoskeletal: Destructive left acetabular lesion an ill-defined left inferior pubic ramus lesion again noted, unchanged. IMPRESSION: Extensive metastatic disease in the chest, abdomen and bony pelvis as described above. This appears progressive with enlarging pulmonary nodules and omental/peritoneal nodules. Infiltrating masses within both kidneys are  difficult to visualize without IV  contrast, but likely similar to prior study. Bilateral nephrolithiasis. Small layering urinary bladder stones. No ureteral stones or hydronephrosis. Trace bilateral pleural effusions. Aortic atherosclerosis, coronary artery disease. Electronically Signed   By: Rolm Baptise M.D.   On: 07/23/2021 13:32     Subjective: Patient seen examined at bedside, resting comfortably.  Spouse present.  Aided with video interpreter with sign language.  Discharging home with home hospice this afternoon once equipment has arrived.  No other complaints or concerns at this time.  Denies headache, no visual changes, no chest pain, no abdominal pain, no fever/chills/night sweats, no nauseous or vomiting.  Discharge Exam: Vitals:   07/26/21 1300 07/26/21 1400  BP: (!) 110/58 119/65  Pulse: 87 92  Resp: 17 (!) 21  Temp:    SpO2: 96% 95%   Vitals:   07/26/21 1200 07/26/21 1230 07/26/21 1300 07/26/21 1400  BP: (!) 110/55 (!) 119/55 (!) 110/58 119/65  Pulse: 92 92 87 92  Resp: 19 17 17  (!) 21  Temp:      TempSrc:      SpO2: 98% 97% 96% 95%  Weight:      Height:        General: Pt is alert, awake, not in acute distress, ill in appearance Cardiovascular: RRR, S1/S2 +, no rubs, no gallops Respiratory: Coarse breath sounds bilaterally with late expiratory wheezing, no crackles, normal [place] effort on 4 L nasal cannula with SPO2 96% at rest Abdominal: Soft, NT, ND, bowel sounds + Extremities: no edema, no cyanosis    The results of significant diagnostics from this hospitalization (including imaging, microbiology, ancillary and laboratory) are listed below for reference.     Microbiology: Recent Results (from the past 240 hour(s))  Blood Culture (routine x 2)     Status: None (Preliminary result)   Collection Time: 07/23/21 10:51 AM   Specimen: BLOOD  Result Value Ref Range Status   Specimen Description BLOOD RIGHT ANTECUBITAL  Final   Special Requests   Final     BOTTLES DRAWN AEROBIC AND ANAEROBIC Blood Culture adequate volume   Culture   Final    NO GROWTH 3 DAYS Performed at [facility], [address] [place], [place] [address]    Report Status PENDING  Incomplete  Urine Culture     Status: None   Collection Time: 07/23/21 10:51 AM   Specimen: In/Out Cath Urine  Result Value Ref Range Status   Specimen Description   Final    IN/OUT CATH URINE Performed at [facility], [address] [place], [place] [address]    Special Requests   Final    NONE Performed at [facility], [address], [place], [place] [address]    Culture   Final    NO GROWTH Performed at [facility], [place] [address], [place], [place] [address]    Report Status 07/24/2021 FINAL  Final  Blood Culture (routine x 2)     Status: Abnormal (Preliminary result)   Collection Time: 07/23/21 10:53 AM   Specimen: BLOOD  Result Value Ref Range Status   Specimen Description   Final    BLOOD LEFT ANTECUBITAL Performed at [facility], [address], [place], [place] [address]    Special Requests   Final    BOTTLES DRAWN AEROBIC AND ANAEROBIC Blood Culture adequate volume Performed at [facility], [address], [place],  [address]    Culture  Setup Time   Final  GRAM POSITIVE RODS CRITICAL RESULT CALLED TO, READ BACK BY AND VERIFIED WITH: NATHAN BELUE @ 0240 ON 07/23/21.Marland KitchenMarland KitchenTKR IN BOTH AEROBIC AND ANAEROBIC BOTTLES Performed at [facility], [address] [place], [place] [address]    Culture (A)  Final    BACILLUS SPECIES Standardized susceptibility testing for this organism is not available. CULTURE REINCUBATED FOR BETTER GROWTH Performed at [facility], [place] [address] [place], [place] [address]    Report Status PENDING  Incomplete  Resp Panel by RT-PCR (Flu A&B, Covid) Nasopharyngeal Swab     Status: None   Collection Time: 07/23/21 10:56 AM   Specimen: Nasopharyngeal Swab;  Nasopharyngeal(NP) swabs in vial transport medium  Result Value Ref Range Status   SARS Coronavirus 2 by RT PCR NEGATIVE NEGATIVE Final    Comment: (NOTE) SARS-CoV-2 target nucleic acids are NOT DETECTED.  The SARS-CoV-2 RNA is generally detectable in upper respiratory specimens during the acute phase of infection. The lowest concentration of SARS-CoV-2 viral copies this assay can detect is 138 copies/mL. A negative result does not preclude SARS-Cov-2 infection and should not be used as the sole basis for treatment or other patient management decisions. A negative result may occur with  improper specimen collection/handling, submission of specimen other than nasopharyngeal swab, presence of viral mutation(s) within the areas targeted by this assay, and inadequate number of viral copies(<138 copies/mL). A negative result must be combined with clinical observations, patient history, and epidemiological information. The expected result is Negative.  Fact Sheet for Patients:  EntrepreneurPulse.com.au  Fact Sheet for Healthcare Providers:  IncredibleEmployment.be  This test is no t yet approved or cleared by the [place] FDA and  has been authorized for detection and/or diagnosis of SARS-CoV-2 by FDA under an Emergency Use Authorization (EUA). This EUA will remain  in effect (meaning this test can be used) for the duration of the COVID-19 declaration under Section 564(b)(1) of the Act, 21 U.S.C.section 360bbb-3(b)(1), unless the authorization is terminated  or revoked sooner.       Influenza A by PCR NEGATIVE NEGATIVE Final   Influenza B by PCR NEGATIVE NEGATIVE Final    Comment: (NOTE) The Xpert Xpress SARS-CoV-2/FLU/RSV plus assay is intended as an aid in the diagnosis of influenza from Nasopharyngeal swab specimens and should not be used as a sole basis for treatment. Nasal washings and aspirates are unacceptable for Xpert Xpress  SARS-CoV-2/FLU/RSV testing.  Fact Sheet for Patients: EntrepreneurPulse.com.au  Fact Sheet for Healthcare Providers: IncredibleEmployment.be  This test is not yet approved or cleared by the [place] FDA and has been authorized for detection and/or diagnosis of SARS-CoV-2 by FDA under an Emergency Use Authorization (EUA). This EUA will remain in effect (meaning this test can be used) for the duration of the COVID-19 declaration under Section 564(b)(1) of the Act, 21 U.S.C. section 360bbb-3(b)(1), unless the authorization is terminated or revoked.  Performed at [facility], [address] [place],  [address]   MRSA Next Gen by PCR, Nasal     Status: None   Collection Time: 07/25/21  8:29 AM   Specimen: Nasal Mucosa; Nasal Swab  Result Value Ref Range Status   MRSA by PCR Next Gen NOT DETECTED NOT DETECTED Final    Comment: (NOTE) The GeneXpert MRSA Assay (FDA approved for NASAL specimens only), is one component of a comprehensive MRSA colonization surveillance program. It is not intended to diagnose MRSA infection nor to guide or monitor treatment for MRSA infections. Test performance is  not FDA approved in patients less than 13 years old. Performed at [facility], [address] [place], [place] [address]   CULTURE, BLOOD (ROUTINE X 2) w Reflex to ID Panel     Status: None (Preliminary result)   Collection Time: 07/25/21  3:30 PM   Specimen: BLOOD  Result Value Ref Range Status   Specimen Description BLOOD RIGHT ANTECUBITAL  Final   Special Requests   Final    BOTTLES DRAWN AEROBIC AND ANAEROBIC Blood Culture adequate volume   Culture   Final    NO GROWTH < 12 HOURS Performed at [facility], [address], [place], [place] [address]    Report Status PENDING  Incomplete  CULTURE, BLOOD (ROUTINE X 2) w Reflex to ID Panel     Status: None (Preliminary result)   Collection Time: 07/25/21  3:33  PM   Specimen: BLOOD  Result Value Ref Range Status   Specimen Description BLOOD BLOOD RIGHT HAND  Final   Special Requests   Final    BOTTLES DRAWN AEROBIC AND ANAEROBIC Blood Culture adequate volume   Culture   Final    NO GROWTH < 12 HOURS Performed at [facility], [address] [place], [place] [address]    Report Status PENDING  Incomplete     Labs: BNP (last 3 results) No results for input(s): BNP in the last 8760 hours. Basic Metabolic Panel: Recent Labs  Lab 07/23/21 1051 07/24/21 0233 07/24/21 0922 07/25/21 0843 07/26/21 0852  NA 124* 129* 139 133* 133*  K 3.8 4.5 4.0 3.6 3.4*  CL 93* 98 106 104 105  CO2 22 22 16* 20* 18*  GLUCOSE 60* 151* 195* 140* 151*  BUN 20 21 17 22 23   CREATININE 1.03 1.03 0.56* 0.80 0.85  CALCIUM 9.1 8.4* 6.6* 8.8* 8.4*  MG  --   --   --  1.8 1.8   Liver Function Tests: Recent Labs  Lab 07/23/21 1051  AST 140*  ALT 54*  ALKPHOS 99  BILITOT 0.9  PROT 7.0  ALBUMIN 2.7*   No results for input(s): LIPASE, AMYLASE in the last 168 hours. No results for input(s): AMMONIA in the last 168 hours. CBC: Recent Labs  Lab 07/23/21 1051 07/24/21 0233 07/25/21 0843 07/26/21 0852  WBC 11.4* 14.1* 16.6* 13.1*  NEUTROABS 8.7*  --  13.2*  --   HGB 12.9* 12.0* 11.5* 10.7*  HCT 38.3* 36.0* 34.8* 32.7*  MCV 91.0 92.3 94.1 92.4  PLT 203 202 147* 143*   Cardiac Enzymes: No results for input(s): CKTOTAL, CKMB, CKMBINDEX, TROPONINI in the last 168 hours. BNP: Invalid input(s): POCBNP CBG: Recent Labs  Lab 07/24/21 0812 07/25/21 0736 07/26/21 0951  GLUCAP 204* 137* 125*   D-Dimer No results for input(s): DDIMER in the last 72 hours. Hgb A1c No results for input(s): HGBA1C in the last 72 hours. Lipid Profile No results for input(s): CHOL, HDL, LDLCALC, TRIG, CHOLHDL, LDLDIRECT in the last 72 hours. Thyroid function studies No results for input(s): TSH, T4TOTAL, T3FREE, THYROIDAB in the last 72 hours.  Invalid input(s):  FREET3 Anemia work up No results for input(s): VITAMINB12, FOLATE, FERRITIN, TIBC, IRON, RETICCTPCT in the last 72 hours. Urinalysis    Component Value Date/Time   COLORURINE AMBER (A) 07/23/2021 1051   APPEARANCEUR CLEAR 07/23/2021 1051   APPEARANCEUR Hazy 09/01/2012 1230   LABSPEC 1.015 07/23/2021 1051   LABSPEC 1.015 09/01/2012 1230   PHURINE 6.0 07/23/2021 1051   GLUCOSEU NEGATIVE 07/23/2021  1051   GLUCOSEU Negative 09/01/2012 1230   HGBUR LARGE (A) 07/23/2021 1051   BILIRUBINUR SMALL (A) 07/23/2021 1051   BILIRUBINUR Negative 09/01/2012 1230   KETONESUR 15 (A) 07/23/2021 1051   PROTEINUR 100 (A) 07/23/2021 1051   NITRITE NEGATIVE 07/23/2021 1051   LEUKOCYTESUR NEGATIVE 07/23/2021 1051   LEUKOCYTESUR 1+ 09/01/2012 1230   Sepsis Labs Invalid input(s): PROCALCITONIN,  WBC,  LACTICIDVEN Microbiology Recent Results (from the past 240 hour(s))  Blood Culture (routine x 2)     Status: None (Preliminary result)   Collection Time: 07/23/21 10:51 AM   Specimen: BLOOD  Result Value Ref Range Status   Specimen Description BLOOD RIGHT ANTECUBITAL  Final   Special Requests   Final    BOTTLES DRAWN AEROBIC AND ANAEROBIC Blood Culture adequate volume   Culture   Final    NO GROWTH 3 DAYS Performed at [facility], [address] [place], [place] [address]    Report Status PENDING  Incomplete  Urine Culture     Status: None   Collection Time: 07/23/21 10:51 AM   Specimen: In/Out Cath Urine  Result Value Ref Range Status   Specimen Description   Final    IN/OUT CATH URINE Performed at [facility], [address], [place], [place] [address]    Special Requests   Final    NONE Performed at [facility], [address] [place], [place] [address]    Culture   Final    NO GROWTH Performed at [facility], [address] [place], [place] [address]    Report Status 07/24/2021 FINAL  Final  Blood Culture (routine x 2)     Status: Abnormal  (Preliminary result)   Collection Time: 07/23/21 10:53 AM   Specimen: BLOOD  Result Value Ref Range Status   Specimen Description   Final    BLOOD LEFT ANTECUBITAL Performed at [facility], [address] [place], [place] [address]    Special Requests   Final    BOTTLES DRAWN AEROBIC AND ANAEROBIC Blood Culture adequate volume Performed at [facility], [address], [place], [place] [address]    Culture  Setup Time   Final    GRAM POSITIVE RODS CRITICAL RESULT CALLED TO, READ BACK BY AND VERIFIED WITH: NATHAN BELUE @ 0626 ON 07/23/21.Marland KitchenMarland KitchenTKR IN BOTH AEROBIC AND ANAEROBIC BOTTLES Performed at [facility], [address] [place], [place] [address]    Culture (A)  Final    BACILLUS SPECIES Standardized susceptibility testing for this organism is not available. CULTURE REINCUBATED FOR BETTER GROWTH Performed at [facility], [place] [address], [place],  [address]    Report Status PENDING  Incomplete  Resp Panel by RT-PCR (Flu A&B, Covid) Nasopharyngeal Swab     Status: None   Collection Time: 07/23/21 10:56 AM   Specimen: Nasopharyngeal Swab; Nasopharyngeal(NP) swabs in vial transport medium  Result Value Ref Range Status   SARS Coronavirus 2 by RT PCR NEGATIVE NEGATIVE Final    Comment: (NOTE) SARS-CoV-2 target nucleic acids are NOT DETECTED.  The SARS-CoV-2 RNA is generally detectable in upper respiratory specimens during the acute phase of infection. The lowest concentration of SARS-CoV-2 viral copies this assay can detect is 138 copies/mL. A negative result does not preclude SARS-Cov-2 infection and should not be used as the sole basis for treatment or other patient management decisions. A negative result may occur with  improper specimen collection/handling, submission of specimen other than  nasopharyngeal swab, presence of viral mutation(s) within the areas targeted by this assay, and inadequate number of viral copies(<138  copies/mL). A negative result must be combined with clinical observations, patient history, and epidemiological information. The expected result is Negative.  Fact Sheet for Patients:  EntrepreneurPulse.com.au  Fact Sheet for Healthcare Providers:  IncredibleEmployment.be  This test is no t yet approved or cleared by the [place] FDA and  has been authorized for detection and/or diagnosis of SARS-CoV-2 by FDA under an Emergency Use Authorization (EUA). This EUA will remain  in effect (meaning this test can be used) for the duration of the COVID-19 declaration under Section 564(b)(1) of the Act, 21 U.S.C.section 360bbb-3(b)(1), unless the authorization is terminated  or revoked sooner.       Influenza A by PCR NEGATIVE NEGATIVE Final   Influenza B by PCR NEGATIVE NEGATIVE Final    Comment: (NOTE) The Xpert Xpress SARS-CoV-2/FLU/RSV plus assay is intended as an aid in the diagnosis of influenza from Nasopharyngeal swab specimens and should not be used as a sole basis for treatment. Nasal washings and aspirates are unacceptable for Xpert Xpress SARS-CoV-2/FLU/RSV testing.  Fact Sheet for Patients: EntrepreneurPulse.com.au  Fact Sheet for Healthcare Providers: IncredibleEmployment.be  This test is not yet approved or cleared by the [place] FDA and has been authorized for detection and/or diagnosis of SARS-CoV-2 by FDA under an Emergency Use Authorization (EUA). This EUA will remain in effect (meaning this test can be used) for the duration of the COVID-19 declaration under Section 564(b)(1) of the Act, 21 U.S.C. section 360bbb-3(b)(1), unless the authorization is terminated or revoked.  Performed at [facility], [address] [place], [place] [address]   MRSA Next Gen by PCR, Nasal     Status: None   Collection Time: 07/25/21  8:29 AM   Specimen: Nasal Mucosa; Nasal Swab  Result  Value Ref Range Status   MRSA by PCR Next Gen NOT DETECTED NOT DETECTED Final    Comment: (NOTE) The GeneXpert MRSA Assay (FDA approved for NASAL specimens only), is one component of a comprehensive MRSA colonization surveillance program. It is not intended to diagnose MRSA infection nor to guide or monitor treatment for MRSA infections. Test performance is not FDA approved in patients less than 25 years old. Performed at [facility], [address] [place], [place] [address]   CULTURE, BLOOD (ROUTINE X 2) w Reflex to ID Panel     Status: None (Preliminary result)   Collection Time: 07/25/21  3:30 PM   Specimen: BLOOD  Result Value Ref Range Status   Specimen Description BLOOD RIGHT ANTECUBITAL  Final   Special Requests   Final    BOTTLES DRAWN AEROBIC AND ANAEROBIC Blood Culture adequate volume   Culture   Final    NO GROWTH < 12 HOURS Performed at [facility], [address] [place], [place] [address]    Report Status PENDING  Incomplete  CULTURE, BLOOD (ROUTINE X 2) w Reflex to ID Panel     Status: None (Preliminary result)   Collection Time: 07/25/21  3:33 PM   Specimen: BLOOD  Result Value Ref Range Status   Specimen Description BLOOD BLOOD RIGHT HAND  Final   Special Requests   Final    BOTTLES DRAWN AEROBIC AND ANAEROBIC Blood Culture adequate volume   Culture   Final    NO GROWTH < 12 HOURS Performed at [facility], [address], [place], [place] [address]  Report Status PENDING  Incomplete     Time coordinating discharge: Over 30 minutes  SIGNED:   Sanjna Haskew J [place] DO  [facility] 07/26/2021, 3:50 PM

## 2021-07-27 ENCOUNTER — Inpatient Hospital Stay: Payer: Medicare HMO | Admitting: Oncology

## 2021-07-27 ENCOUNTER — Telehealth: Payer: Self-pay | Admitting: *Deleted

## 2021-07-27 LAB — CULTURE, BLOOD (ROUTINE X 2): Special Requests: ADEQUATE

## 2021-07-27 NOTE — Telephone Encounter (Signed)
Verbal order called to [facility] at [facility]

## 2021-07-27 NOTE — Telephone Encounter (Signed)
Hospice called asking if Dr Janese Banks will serve as attending for hospice and sign orders. Please advise

## 2021-07-27 NOTE — Telephone Encounter (Signed)
yes

## 2021-07-28 LAB — CULTURE, BLOOD (ROUTINE X 2)
Culture: NO GROWTH
Special Requests: ADEQUATE

## 2021-07-31 LAB — CULTURE, BLOOD (ROUTINE X 2)
Culture: NO GROWTH
Culture: NO GROWTH
Special Requests: ADEQUATE
Special Requests: ADEQUATE

## 2021-08-01 ENCOUNTER — Other Ambulatory Visit: Payer: Medicare HMO

## 2021-08-01 ENCOUNTER — Encounter: Payer: Self-pay | Admitting: *Deleted

## 2021-08-01 ENCOUNTER — Ambulatory Visit: Payer: Medicare HMO | Admitting: Oncology

## 2021-08-29 DEATH — deceased

## 2023-12-03 IMAGING — CT CT RENAL STONE PROTOCOL
2 of 4 series · 16 of 46 positions shown, 18 images · non-contrast
Comparison: 07/05/2021

CLINICAL DATA: Flank pain, kidney stone suspected. Code sepsis.
Hypotension.

EXAM:
CT ABDOMEN AND PELVIS WITHOUT CONTRAST
TECHNIQUE: Multidetector CT imaging of the abdomen and pelvis was performed
following the standard protocol without IV contrast.

[Series 2: stone full standard · axial · 0.80mm/px · z∈[-839,-404]mm · 13 of 95 slices shown, 15 images]
[im 4/95  soft-tissue]
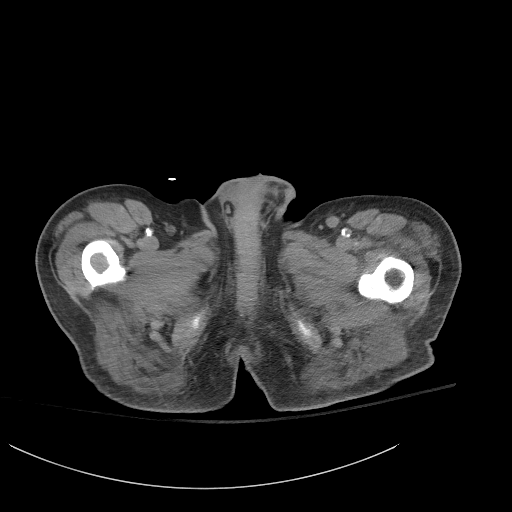
[im 4/95  bone]
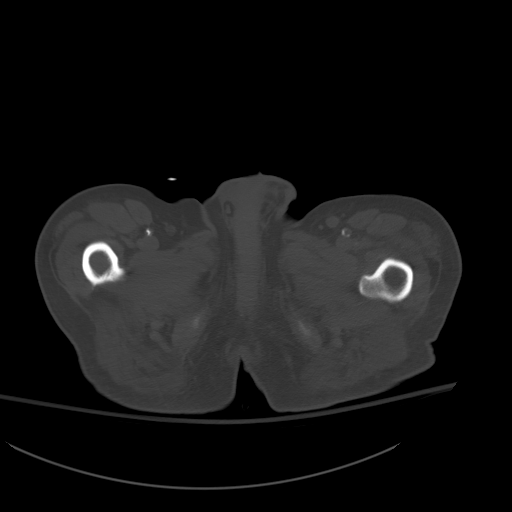
[im 12/95  soft-tissue]
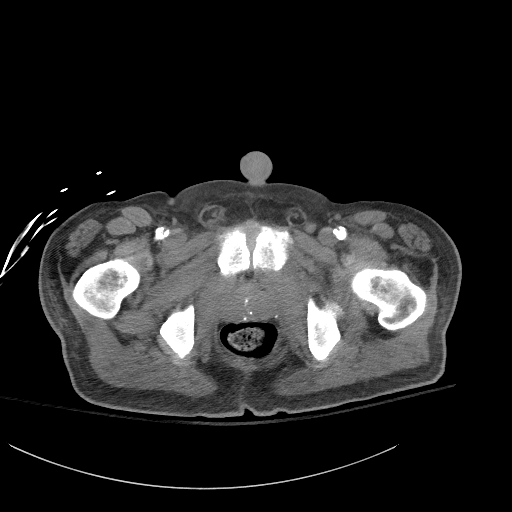
[im 19/95  soft-tissue]
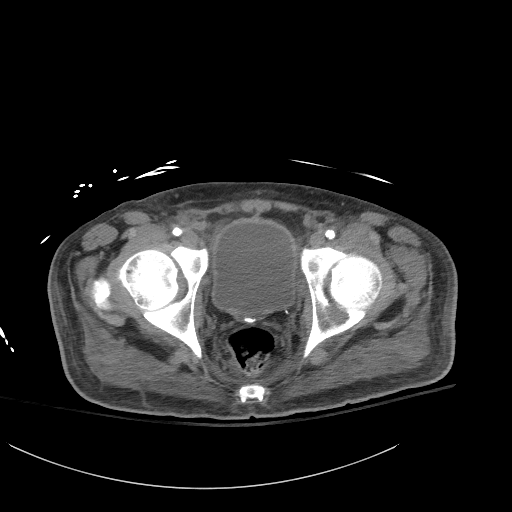
[im 27/95  soft-tissue]
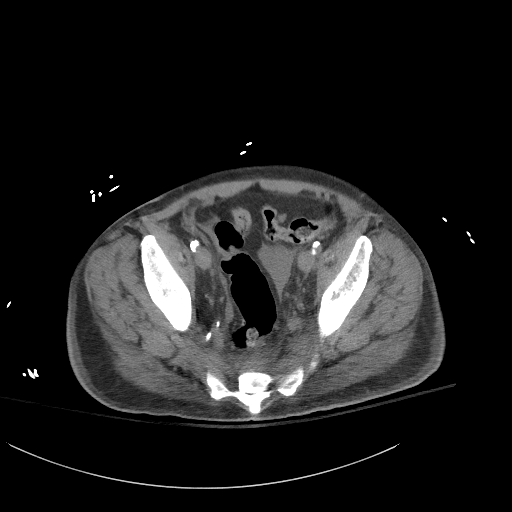
[im 34/95  soft-tissue]
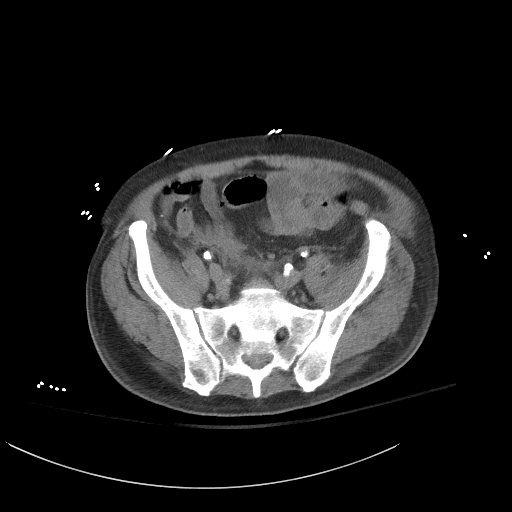
[im 42/95  soft-tissue]
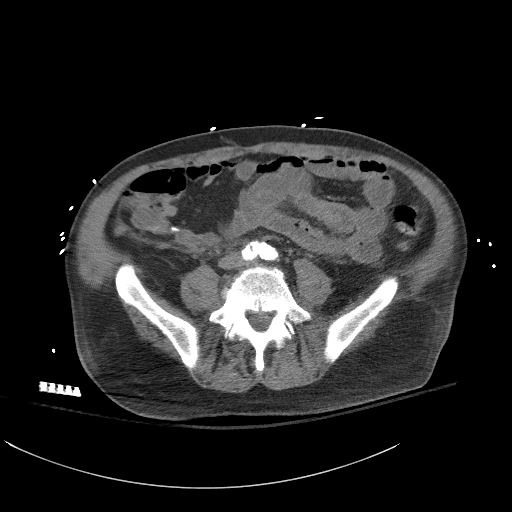
[im 49/95  soft-tissue]
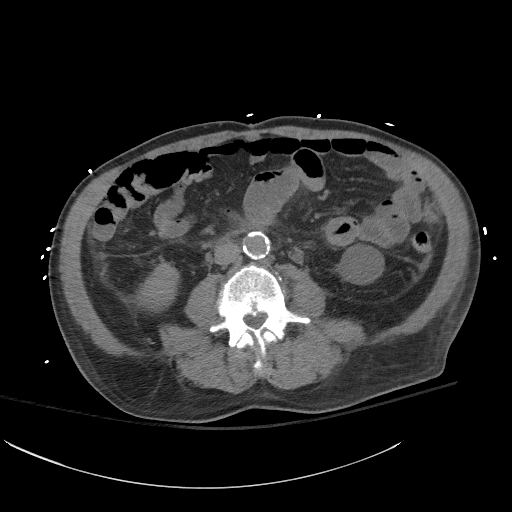
[im 53/95  soft-tissue]
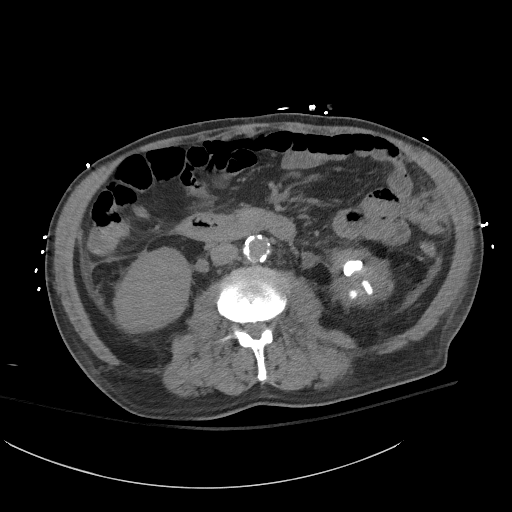
[im 61/95  soft-tissue]
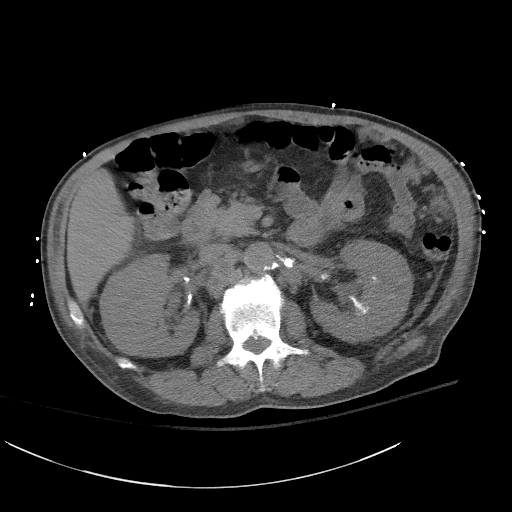
[im 61/95  bone]
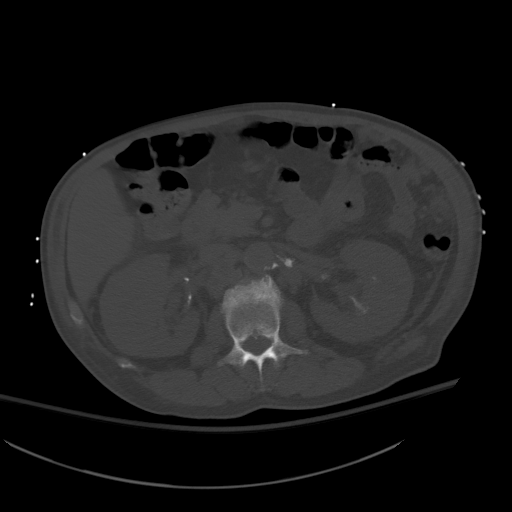
[im 68/95  soft-tissue]
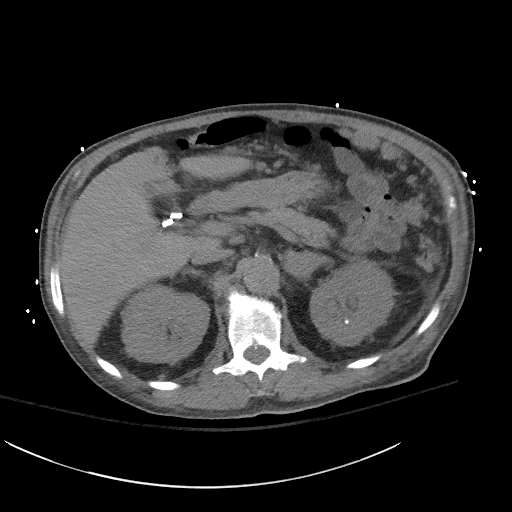
[im 76/95  soft-tissue]
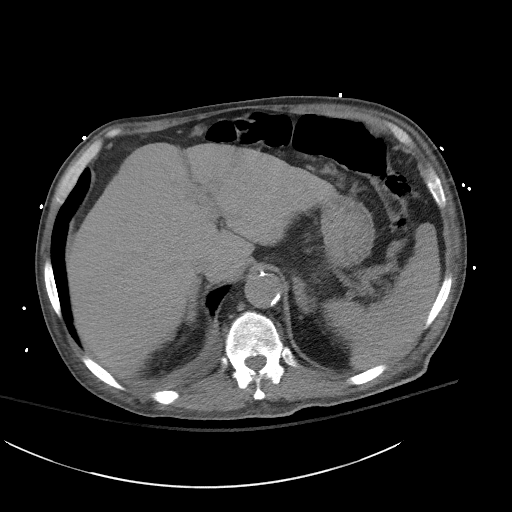
[im 83/95  soft-tissue]
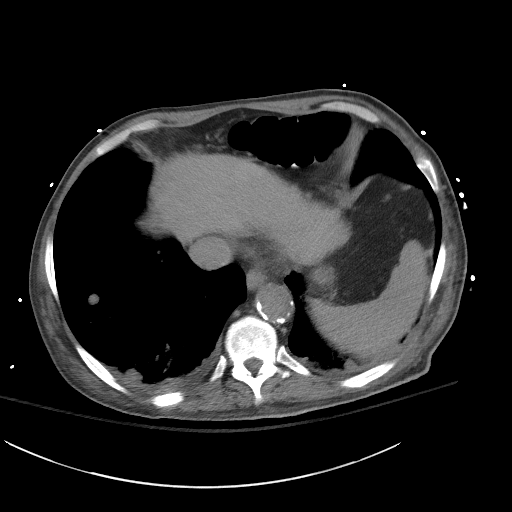
[im 91/95  soft-tissue]
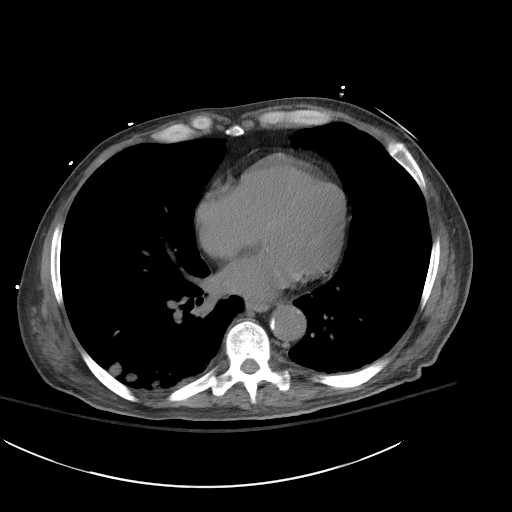

[Series 5: coronal · coronal · 0.77mm/px · 3 of 133 slices shown]
[im 45/133  soft-tissue]
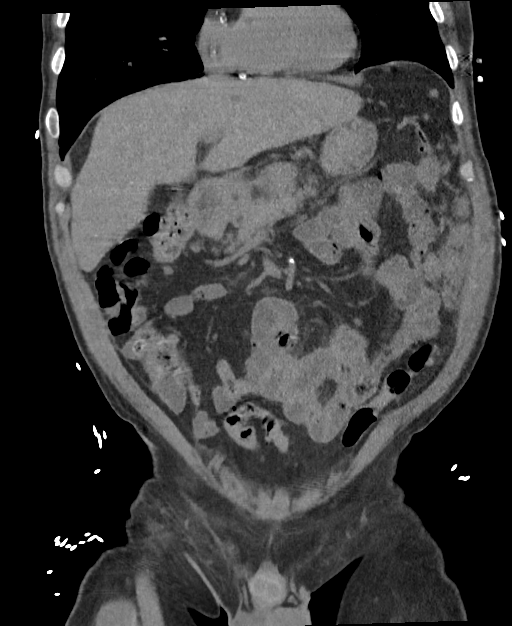
[im 59/133  soft-tissue]
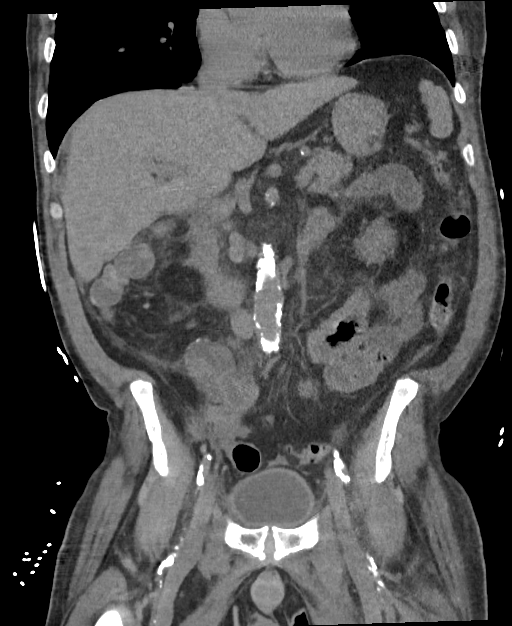
[im 74/133  soft-tissue]
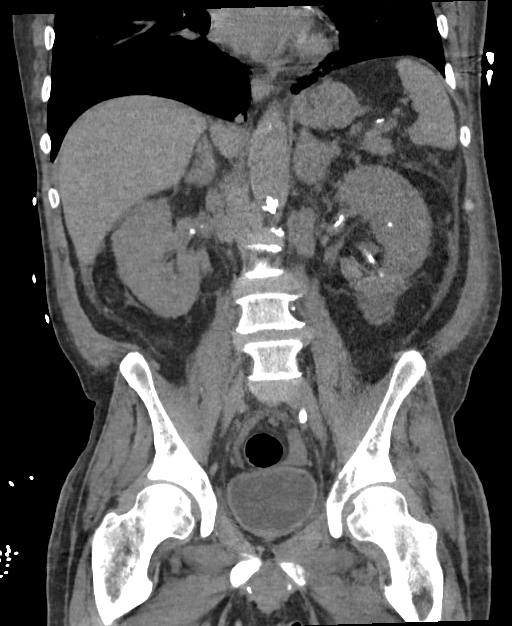

[16 of 46 positions shown; findings below may reference images not displayed]

FINDINGS: Lower chest: Heart is normal size. Coronary artery and aortic
calcifications. Numerous bilateral lower lobe pulmonary nodules and
masses. The largest is in the left lung base measuring up to 3.4 x
2.9 cm compared with 2.7 x 2.3 cm previously. Many other pulmonary
nodules also appear enlarged since prior study. Trace bilateral
pleural effusions.

Hepatobiliary: Prior cholecystectomy. Small hypodensity in the left
hepatic lobe likely reflects cyst. Prior cholecystectomy.

Pancreas: No focal abnormality or ductal dilatation.

Spleen: No focal abnormality.  Normal size.

Adrenals/Urinary Tract: Bilateral adrenal masses are unchanged.
Infiltrative appearing masses in both kidneys again noted, appear
unchanged. Bilateral nephrolithiasis. No ureteral stones or
hydronephrosis. Small layering stones dependently within the urinary
bladder. Bladder wall is unremarkable.

Stomach/Bowel: No evidence of bowel obstruction.

Vascular/Lymphatic: Aortoiliac atherosclerosis. Retroperitoneal
adenopathy is stable.

Reproductive: No visible focal abnormality.

Other: Extensive peritoneal nodularity/disease and omental
nodularity. This has progressed since prior study. Index left
peritoneal nodule measures up to 3.7 cm compared to 3.2 cm
previously. Other peritoneal/omental nodules appear larger.

Musculoskeletal: Destructive left acetabular lesion an ill-defined
left inferior pubic ramus lesion again noted, unchanged.
IMPRESSION: Extensive metastatic disease in the chest, abdomen and bony pelvis
as described above. This appears progressive with enlarging
pulmonary nodules and omental/peritoneal nodules.

Infiltrating masses within both kidneys are difficult to visualize
without IV contrast, but likely similar to prior study.

Bilateral nephrolithiasis. Small layering urinary bladder stones. No
ureteral stones or hydronephrosis.

Trace bilateral pleural effusions.

Aortic atherosclerosis, coronary artery disease.

## 2023-12-03 IMAGING — DX DG CHEST 1V PORT
2 series · 2 of 2 positions shown · non-contrast
Comparison: None.

CLINICAL DATA: Questionable sepsis. New lung cancer diagnosis with
metastasis. Multiple recent falls.

EXAM:
PORTABLE CHEST 1 VIEW

[chest ap (1 of 2)]
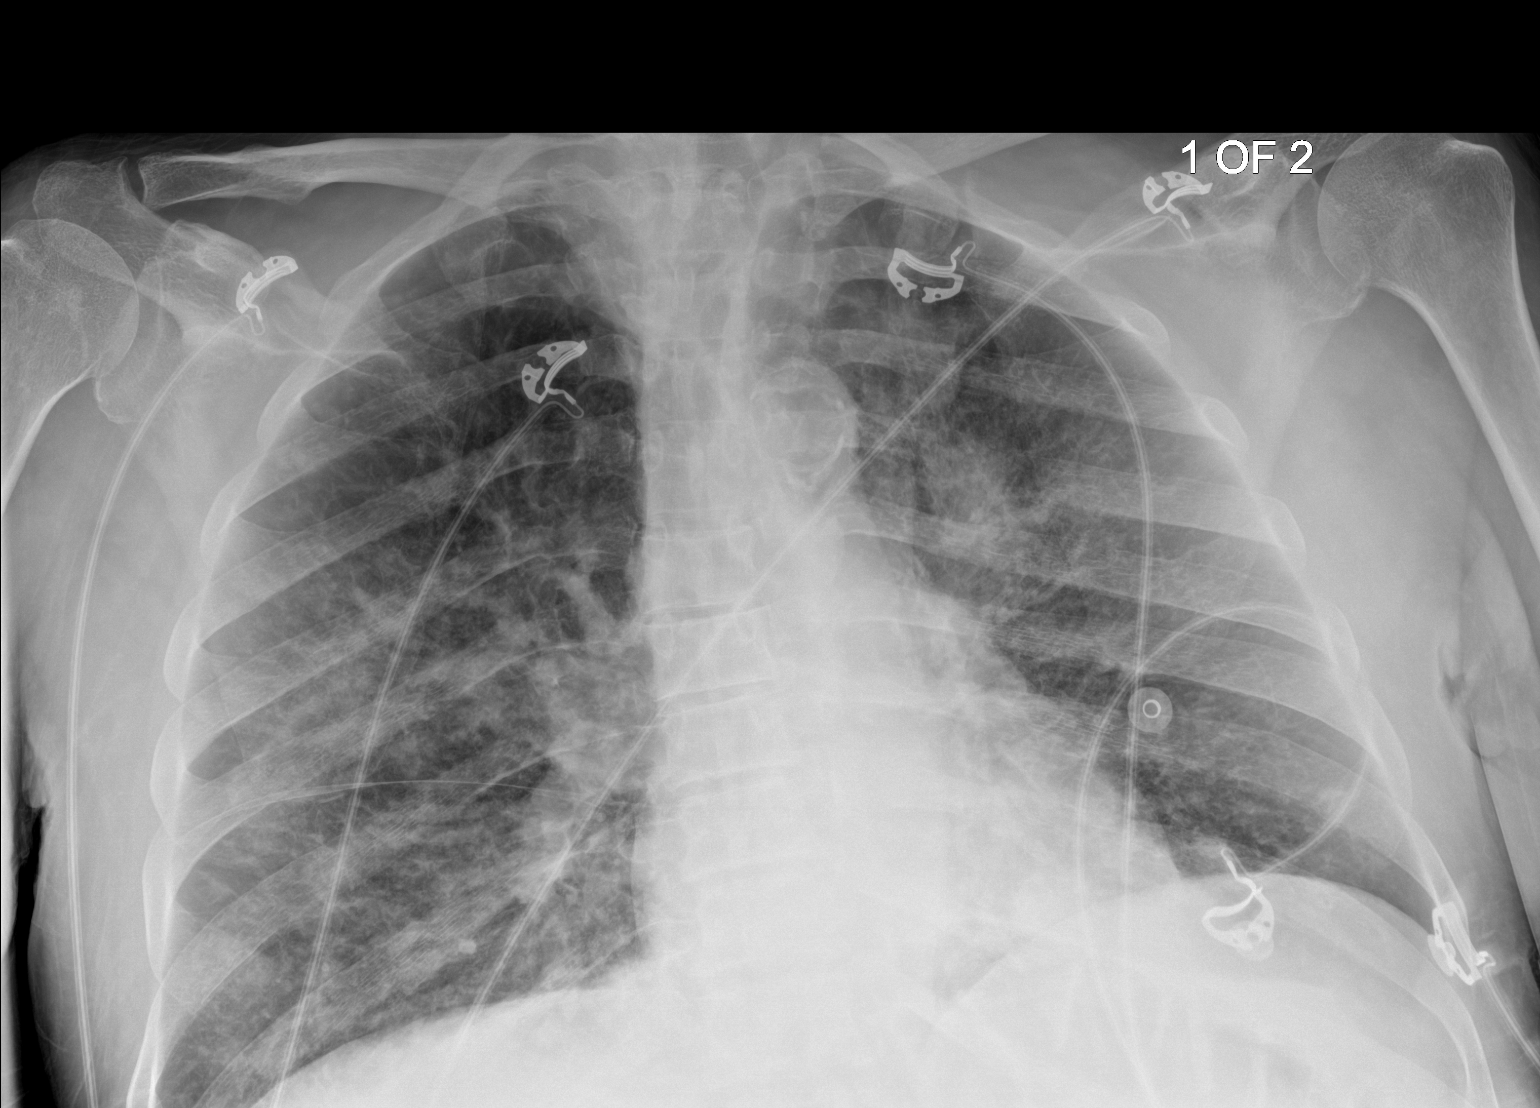

[chest ap (2 of 2)]
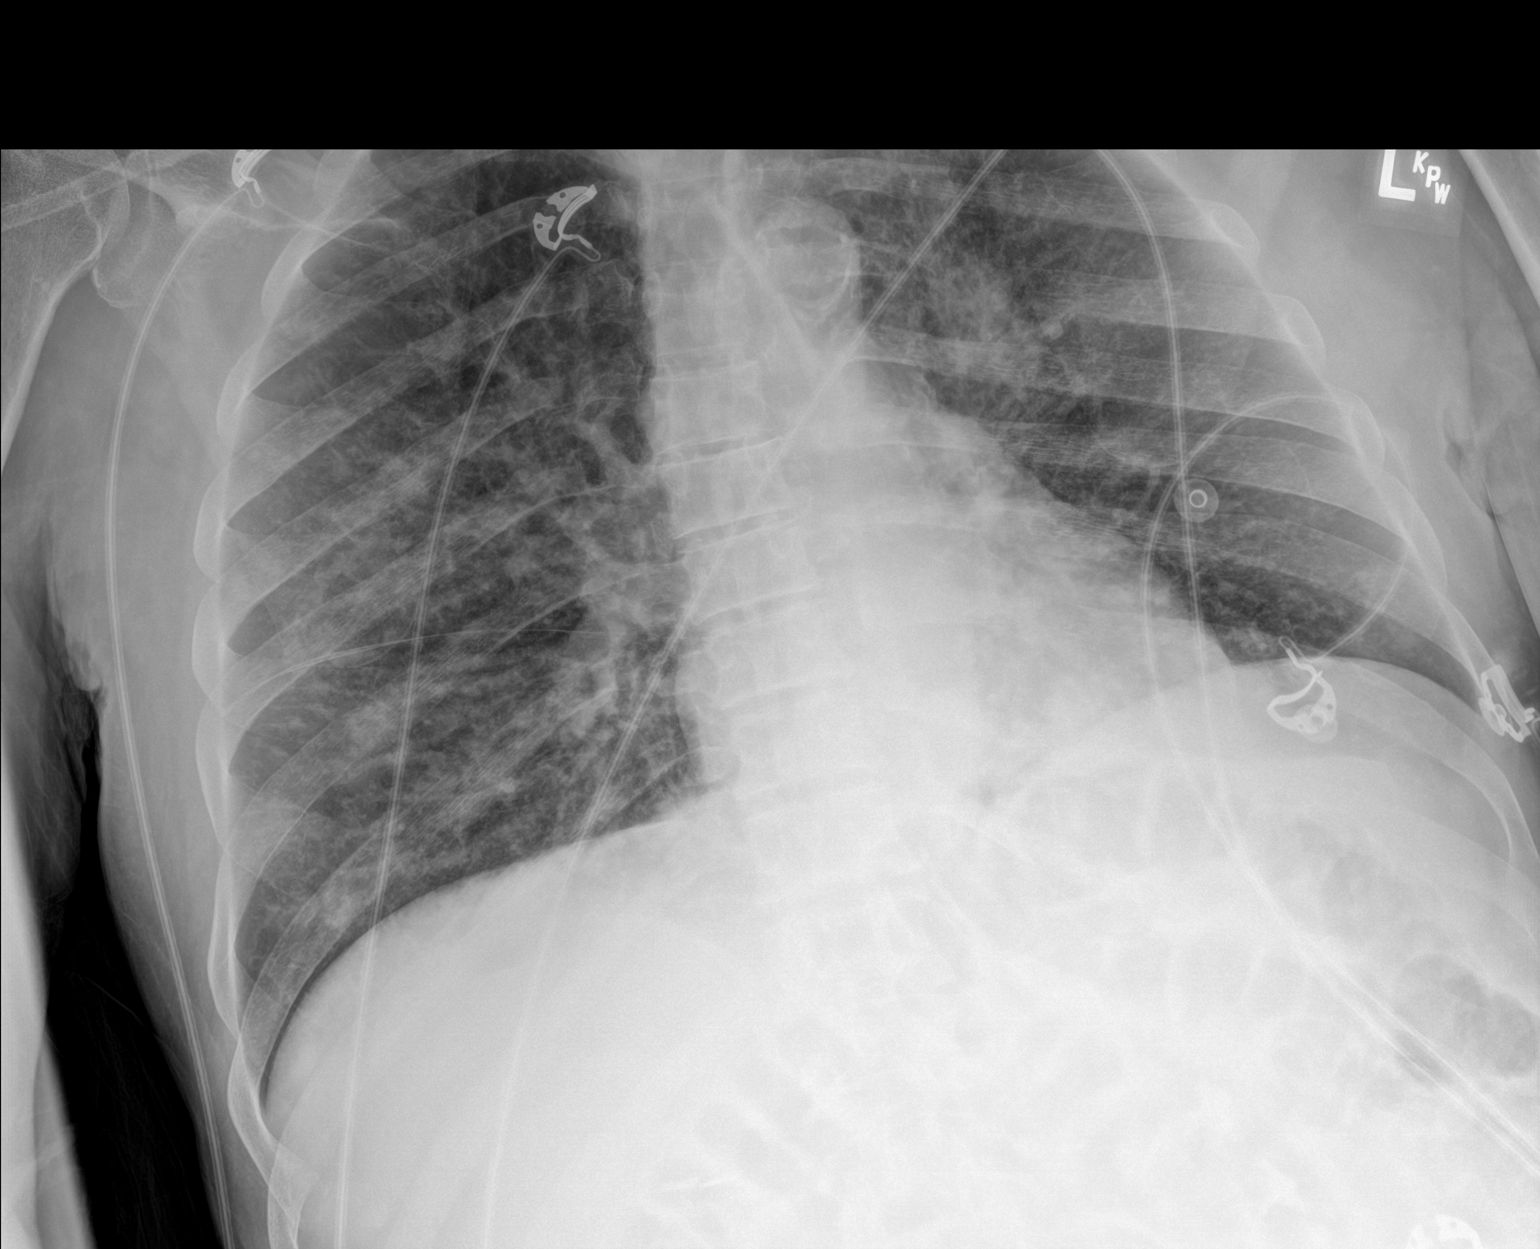

[2 of 2 positions shown; findings below may reference images not displayed]

FINDINGS: Normal cardiac silhouette. Diffuse patchy airspace disease within
LEFT and RIGHT lung. Underlying nodularity is less well-defined on
the background of airspace disease. No focal consolidation. No
pneumothorax. Findings worsened from comparison radiograph. Multiple
pulmonary nodules on CT 07/05/2021.
IMPRESSION: 1. Worsening pulmonary edema or pulmonary infection pattern.
2. Underlying bilateral pulmonary nodules consistent with pulmonary
metastasis.
3. Consider head CT or MRI to exclude brain metastasis with multiple
recent falls and new metastatic lung cancer
